# Patient Record
Sex: Female | Born: 1980 | Race: Black or African American | Hispanic: No | Marital: Single | Smoking: Never smoker
Health system: Southern US, Community
[De-identification: ages and names within clinical notes are randomized; demographics above are authoritative.]

## PROBLEM LIST (undated history)

## (undated) DIAGNOSIS — R519 Headache, unspecified: Secondary | ICD-10-CM

## (undated) DIAGNOSIS — B3731 Acute candidiasis of vulva and vagina: Secondary | ICD-10-CM

## (undated) DIAGNOSIS — B009 Herpesviral infection, unspecified: Secondary | ICD-10-CM

## (undated) DIAGNOSIS — B373 Candidiasis of vulva and vagina: Secondary | ICD-10-CM

## (undated) DIAGNOSIS — R51 Headache: Secondary | ICD-10-CM

## (undated) DIAGNOSIS — K219 Gastro-esophageal reflux disease without esophagitis: Secondary | ICD-10-CM

## (undated) HISTORY — DX: Candidiasis of vulva and vagina: B37.3

## (undated) HISTORY — PX: BREAST SURGERY: SHX581

## (undated) HISTORY — DX: Acute candidiasis of vulva and vagina: B37.31

## (undated) HISTORY — DX: Herpesviral infection, unspecified: B00.9

## (undated) HISTORY — DX: Gastro-esophageal reflux disease without esophagitis: K21.9

## (undated) HISTORY — PX: WISDOM TOOTH EXTRACTION: SHX21

---

## 2004-10-06 ENCOUNTER — Other Ambulatory Visit: Admission: RE | Admit: 2004-10-06 | Discharge: 2004-10-06 | Payer: Self-pay | Admitting: Family Medicine

## 2004-10-06 ENCOUNTER — Ambulatory Visit: Payer: Self-pay | Admitting: Family Medicine

## 2004-11-07 ENCOUNTER — Ambulatory Visit: Payer: Self-pay | Admitting: Family Medicine

## 2005-02-06 ENCOUNTER — Ambulatory Visit: Payer: Self-pay | Admitting: Family Medicine

## 2005-02-09 ENCOUNTER — Other Ambulatory Visit: Admission: RE | Admit: 2005-02-09 | Discharge: 2005-02-09 | Payer: Self-pay | Admitting: Family Medicine

## 2005-02-09 ENCOUNTER — Ambulatory Visit: Payer: Self-pay | Admitting: Family Medicine

## 2005-03-24 ENCOUNTER — Ambulatory Visit: Payer: Self-pay | Admitting: Family Medicine

## 2005-04-29 ENCOUNTER — Ambulatory Visit: Payer: Self-pay | Admitting: Family Medicine

## 2005-07-10 ENCOUNTER — Ambulatory Visit: Payer: Self-pay | Admitting: Family Medicine

## 2005-10-01 ENCOUNTER — Ambulatory Visit: Payer: Self-pay | Admitting: Family Medicine

## 2006-09-08 ENCOUNTER — Other Ambulatory Visit: Admission: RE | Admit: 2006-09-08 | Discharge: 2006-09-08 | Payer: Self-pay | Admitting: Family Medicine

## 2006-09-08 ENCOUNTER — Ambulatory Visit: Payer: Self-pay | Admitting: Family Medicine

## 2006-09-08 ENCOUNTER — Encounter: Payer: Self-pay | Admitting: Family Medicine

## 2006-09-08 DIAGNOSIS — K219 Gastro-esophageal reflux disease without esophagitis: Secondary | ICD-10-CM

## 2006-09-08 DIAGNOSIS — N760 Acute vaginitis: Secondary | ICD-10-CM | POA: Insufficient documentation

## 2006-09-10 ENCOUNTER — Encounter: Payer: Self-pay | Admitting: Family Medicine

## 2006-09-10 LAB — CONVERTED CEMR LAB
Trich, Wet Prep: NONE SEEN
WBC, Wet Prep HPF POC: NONE SEEN
Yeast Wet Prep HPF POC: NONE SEEN

## 2006-11-01 ENCOUNTER — Telehealth (INDEPENDENT_AMBULATORY_CARE_PROVIDER_SITE_OTHER): Payer: Self-pay | Admitting: *Deleted

## 2006-11-01 ENCOUNTER — Ambulatory Visit: Payer: Self-pay | Admitting: Family Medicine

## 2006-11-01 ENCOUNTER — Encounter: Admission: RE | Admit: 2006-11-01 | Discharge: 2006-11-01 | Payer: Self-pay | Admitting: Family Medicine

## 2006-11-01 DIAGNOSIS — R109 Unspecified abdominal pain: Secondary | ICD-10-CM | POA: Insufficient documentation

## 2006-11-01 DIAGNOSIS — K581 Irritable bowel syndrome with constipation: Secondary | ICD-10-CM | POA: Insufficient documentation

## 2006-11-01 DIAGNOSIS — K5900 Constipation, unspecified: Secondary | ICD-10-CM | POA: Insufficient documentation

## 2006-11-02 LAB — CONVERTED CEMR LAB
Alkaline Phosphatase: 62 units/L (ref 39–117)
BUN: 13 mg/dL (ref 6–23)
Basophils Absolute: 0 10*3/uL (ref 0.0–0.1)
Chloride: 104 meq/L (ref 96–112)
Creatinine, Ser: 0.78 mg/dL (ref 0.40–1.20)
Eosinophils Absolute: 0.2 10*3/uL (ref 0.0–0.7)
Eosinophils Relative: 2 % (ref 0–5)
HCT: 41.6 % (ref 36.0–46.0)
Lymphocytes Relative: 40 % (ref 12–46)
MCHC: 32.9 g/dL (ref 30.0–36.0)
MCV: 90.2 fL (ref 78.0–100.0)
Monocytes Relative: 9 % (ref 3–11)
Sodium: 138 meq/L (ref 135–145)
WBC: 8.6 10*3/uL (ref 4.0–10.5)

## 2006-11-03 ENCOUNTER — Telehealth (INDEPENDENT_AMBULATORY_CARE_PROVIDER_SITE_OTHER): Payer: Self-pay | Admitting: *Deleted

## 2007-02-09 ENCOUNTER — Ambulatory Visit: Payer: Self-pay | Admitting: Family Medicine

## 2007-02-09 DIAGNOSIS — N949 Unspecified condition associated with female genital organs and menstrual cycle: Secondary | ICD-10-CM

## 2007-02-10 ENCOUNTER — Telehealth: Payer: Self-pay | Admitting: Family Medicine

## 2007-03-01 ENCOUNTER — Ambulatory Visit: Payer: Self-pay | Admitting: Family Medicine

## 2007-03-01 DIAGNOSIS — L299 Pruritus, unspecified: Secondary | ICD-10-CM | POA: Insufficient documentation

## 2007-03-02 ENCOUNTER — Encounter: Payer: Self-pay | Admitting: Family Medicine

## 2007-03-15 ENCOUNTER — Ambulatory Visit: Payer: Self-pay | Admitting: Family Medicine

## 2007-03-18 ENCOUNTER — Encounter: Payer: Self-pay | Admitting: Family Medicine

## 2007-04-21 ENCOUNTER — Ambulatory Visit: Payer: Self-pay | Admitting: Family Medicine

## 2007-04-21 DIAGNOSIS — G43919 Migraine, unspecified, intractable, without status migrainosus: Secondary | ICD-10-CM | POA: Insufficient documentation

## 2007-05-03 ENCOUNTER — Telehealth: Payer: Self-pay | Admitting: Family Medicine

## 2007-05-23 ENCOUNTER — Ambulatory Visit: Payer: Self-pay | Admitting: Family Medicine

## 2007-05-23 DIAGNOSIS — G43009 Migraine without aura, not intractable, without status migrainosus: Secondary | ICD-10-CM | POA: Insufficient documentation

## 2007-06-22 ENCOUNTER — Ambulatory Visit: Payer: Self-pay | Admitting: Family Medicine

## 2007-06-22 ENCOUNTER — Other Ambulatory Visit: Admission: RE | Admit: 2007-06-22 | Discharge: 2007-06-22 | Payer: Self-pay | Admitting: Family Medicine

## 2007-06-22 ENCOUNTER — Encounter: Payer: Self-pay | Admitting: Family Medicine

## 2007-10-19 ENCOUNTER — Ambulatory Visit: Payer: Self-pay | Admitting: Obstetrics & Gynecology

## 2007-10-19 LAB — CONVERTED CEMR LAB: Chlamydia, DNA Probe: NEGATIVE

## 2007-11-30 ENCOUNTER — Ambulatory Visit: Payer: Self-pay | Admitting: Obstetrics & Gynecology

## 2007-12-01 ENCOUNTER — Encounter: Payer: Self-pay | Admitting: Obstetrics & Gynecology

## 2007-12-01 LAB — CONVERTED CEMR LAB: Trich, Wet Prep: NONE SEEN

## 2008-01-18 ENCOUNTER — Ambulatory Visit: Payer: Self-pay | Admitting: Obstetrics & Gynecology

## 2008-01-18 LAB — CONVERTED CEMR LAB

## 2008-01-19 ENCOUNTER — Encounter: Payer: Self-pay | Admitting: Obstetrics & Gynecology

## 2008-01-19 LAB — CONVERTED CEMR LAB
Chlamydia, Swab/Urine, PCR: NEGATIVE
TSH: 0.775 microintl units/mL (ref 0.350–4.50)

## 2008-01-31 ENCOUNTER — Encounter: Payer: Self-pay | Admitting: Obstetrics & Gynecology

## 2008-01-31 LAB — CONVERTED CEMR LAB: Prolactin: 14.3 ng/mL

## 2008-02-29 ENCOUNTER — Ambulatory Visit: Payer: Self-pay | Admitting: Obstetrics & Gynecology

## 2008-03-09 ENCOUNTER — Ambulatory Visit: Payer: Self-pay | Admitting: Family

## 2008-03-10 ENCOUNTER — Encounter: Payer: Self-pay | Admitting: Family

## 2008-03-10 LAB — CONVERTED CEMR LAB

## 2008-08-29 ENCOUNTER — Ambulatory Visit: Payer: Self-pay | Admitting: Obstetrics & Gynecology

## 2008-08-29 ENCOUNTER — Encounter: Payer: Self-pay | Admitting: Obstetrics & Gynecology

## 2008-08-29 ENCOUNTER — Other Ambulatory Visit: Admission: RE | Admit: 2008-08-29 | Discharge: 2008-08-29 | Payer: Self-pay | Admitting: Obstetrics & Gynecology

## 2008-08-30 ENCOUNTER — Encounter: Payer: Self-pay | Admitting: Obstetrics & Gynecology

## 2008-08-30 LAB — CONVERTED CEMR LAB

## 2008-10-10 ENCOUNTER — Ambulatory Visit: Payer: Self-pay | Admitting: Obstetrics & Gynecology

## 2008-10-10 LAB — CONVERTED CEMR LAB
Chlamydia, DNA Probe: NEGATIVE
GC Probe Amp, Genital: NEGATIVE
Trich, Wet Prep: NONE SEEN

## 2009-01-08 ENCOUNTER — Ambulatory Visit: Payer: Self-pay | Admitting: Obstetrics & Gynecology

## 2009-01-09 ENCOUNTER — Encounter: Payer: Self-pay | Admitting: Obstetrics & Gynecology

## 2009-01-09 LAB — CONVERTED CEMR LAB: Chlamydia, DNA Probe: NEGATIVE

## 2009-07-03 ENCOUNTER — Ambulatory Visit: Payer: Self-pay | Admitting: Obstetrics & Gynecology

## 2009-07-04 ENCOUNTER — Encounter: Payer: Self-pay | Admitting: Obstetrics & Gynecology

## 2009-07-04 LAB — CONVERTED CEMR LAB
Clue Cells Wet Prep HPF POC: NONE SEEN
WBC, Wet Prep HPF POC: NONE SEEN
Yeast Wet Prep HPF POC: NONE SEEN

## 2009-11-12 ENCOUNTER — Ambulatory Visit: Payer: Self-pay | Admitting: Obstetrics & Gynecology

## 2009-11-13 ENCOUNTER — Encounter: Payer: Self-pay | Admitting: Obstetrics & Gynecology

## 2009-11-13 LAB — CONVERTED CEMR LAB
Chlamydia, DNA Probe: NEGATIVE
Clue Cells Wet Prep HPF POC: NONE SEEN
Trich, Wet Prep: NONE SEEN

## 2010-05-20 NOTE — Assessment & Plan Note (Signed)
[name], [name]                ACCOUNT NO.:  [id number]   MEDICAL RECORD NO.:  [id number]          PATIENT TYPE:  POB   LOCATION:  [facility] [place]         FACILITY:  [facility]   PHYSICIAN:  Allie Bossier, MD        DATE OF BIRTH:  22-Jun-1980   DATE OF SERVICE:                                  CLINIC NOTE   [name] is a 30 year old engaged black gravida 1, para 1 with a 9-year-  old son.  She comes in today complaining of a vaginal discharge for the  last 3 days.  She complains of a fall.  Please note that she was treated  for BV back in November 2009, with Flagyl.  She is allergic to  AMOXICILLIN, but nothing else.   PHYSICAL EXAMINATION:  She does have some thin white discharge  consistent with BV, I have checked a wet prep and cervical cultures.  Please note that her last period was November 2009, pregnancy test is  negative, and she has a PeriGuard in, but I am ordering a TSH as well.  She has had weight gain.  As soon as the results of the wet prep are  back, we will call her, add a prescription as necessary.      Allie Bossier, MD     MCD/MEDQ  D:  01/18/2008  T:  01/19/2008  Job:  [id number]

## 2010-05-20 NOTE — Assessment & Plan Note (Signed)
NAMEAUTYM, SIESS                ACCOUNT NO.:  [id number]   MEDICAL RECORD NO.:  [id number]          PATIENT TYPE:  POB   LOCATION:  [facility] [place]         FACILITY:  [facility]   PHYSICIAN:  Elsie Lincoln, MD      DATE OF BIRTH:  1980-12-04   DATE OF SERVICE:  10/10/2008                                  CLINIC NOTE   The patient is a 30 year old female who presents for fishy odor.  No  itching.  The patient was diagnosed with bacterial vaginosis in August.  The patient has ParaGard in site.  The patient claims to be in  monogamous relationships but would like to be tested for gonorrhea and  Chlamydia.   PHYSICAL EXAMINATION:  GENITOURINARY:  Tanner V.  Vagina pink.  Normal  rugae.  Some increased white discharge.  No odor.  Cervical os.  ParaGard string seen.   A 30 year old female with vaginal discharge.  1. Gonorrhea and Chlamydia.  2. Wet prep.  3. Return p.r.n.  4. Health care maintenance up to date.           ______________________________  Elsie Lincoln, MD     KL/MEDQ  D:  10/10/2008  T:  10/11/2008  Job:  [id number]

## 2010-05-20 NOTE — Assessment & Plan Note (Signed)
[name], [name]                ACCOUNT NO.:  [id number]   MEDICAL RECORD NO.:  [id number]          PATIENT TYPE:  POB   LOCATION:  [facility] [place]         FACILITY:  [facility]   PHYSICIAN:  Allie Bossier, MD        DATE OF BIRTH:  06-12-1980   DATE OF SERVICE:  07/03/2009                                  CLINIC NOTE   [name] is a 30 year old lady who seems to have recurrent vaginal  infections.  She comes in today with again with a fishy odor discharge  that is itchy, multiple times she has been checked for GC and Chlamydia  that always been negative here, but recurrent wet prep continue to show  BV and yeast, so I am giving her a prescription for Cleocin cream to be  used for a week, I have given her 6 refills and 6 refills of Diflucan to  be used on a p.r.n. basis.      Allie Bossier, MD     MCD/MEDQ  D:  07/03/2009  T:  07/04/2009  Job:  [id number]

## 2010-05-20 NOTE — Assessment & Plan Note (Signed)
NAMEKASSAUNDRA, HAIR                ACCOUNT NO.:  [id number]   MEDICAL RECORD NO.:  [id number]          PATIENT TYPE:  POB   LOCATION:  [facility] [place]         FACILITY:  [facility]   PHYSICIAN:  Elsie Lincoln, MD      DATE OF BIRTH:  07/30/1980   DATE OF SERVICE:  10/19/2007                                  CLINIC NOTE   The patient is a 30 year old para 1 female who presents for an IUD  insertion.  She had her IUD placed somewhere to be 9-10 years ago.  She  is not sure exactly when.  We will go ahead and do it today, as we do  not want to expire.  We will keep good records in our chart, so next  time she knows exactly when she is to be removed that would be October 18, 2017.   PAST MEDICAL HISTORY:  Denies all problems.   PAST SURGICAL HISTORY:  Denies all surgeries.  No history of a blood  transfusion.   OBSTETRICAL HISTORY:  NSVD x1.   GYNECOLOGICAL HISTORY:  No history of abnormal Pap smear.  No history of  gonorrhea and chlamydia or pelvic inflammatory disease or ectopic  pregnancy.  She currently has an IUD in place.   PHYSICAL EXAMINATION:  VITAL SIGNS:  Afebrile, stable.  ABDOMEN:  Soft, nontender, nondistended.  No rebound or guarding.  GENITALIA:  Tanner V.  Vagina pink, normal rugae.  Cervix closed,  nontender.  Uterus anteverted, nontender.   PROCEDURE NOTE:  After informed consent was obtained, the patient was  placed in dorsal lithotomy position, prepared and draped in normal  sterile fashion.  Betadine was used to clean the cervix and until the  cervix was grasped with single-tooth tenaculum.  The IUD was removed.  GC and chlamydia cultures were taken prior to cleaning the cervix with  Betadine.  The uterus sounded to approximately 6.5 cm and IUD was  inserted without problem.  Strings were cut to 4 cm.  The patient  tolerated the procedure well.  The patient told to take Motrin for pain.   ASSESSMENT/PLAN:  A 30 year old female for intrauterine device  removal  and insertion.  This was done without incident.  The patient is to come  back in 6 weeks for string check.           ______________________________  Elsie Lincoln, MD     KL/MEDQ  D:  10/19/2007  T:  10/20/2007  Job:  [id number]

## 2010-05-20 NOTE — Assessment & Plan Note (Signed)
NAMETAREA, [name]                ACCOUNT NO.:  [id number]   MEDICAL RECORD NO.:  [id number]          PATIENT TYPE:  POB   LOCATION:  [facility] [place]         FACILITY:  [facility]   PHYSICIAN:  Jaynie Collins, MD     DATE OF BIRTH:  March 13, 1980   DATE OF SERVICE:  01/08/2009                                  CLINIC NOTE   CHIEF COMPLAINT:  Vaginal discharge.   HISTORY OF PRESENT ILLNESS:  The patient is a 30 year old gravida 1,  para 1 who is here today complaining of abnormal vaginal discharge for  over a month.  She does report that the vaginal discharge has a fishy  odor.  It is white and itchy.  Of note, she was diagnosed with bacterial  vaginosis in August and came back in October and was diagnosed with  another episode of bacterial vaginosis and also a yeast infection.  On  review of her chart, the patient has come in multiple times for this  complaint.  The patient denies any other symptoms.  She is just worried  about having this recurrent episodes of abnormal vaginal discharge.   PHYSICAL EXAMINATION:  On pelvic exam, the patient has normal external  female genitalia.  Pink, well-rugated vagina; increased white thick  discharge without any odor.  No erythema or lesions seen and normal  cervical os with ParaGard strings seen.   Wet prep and gonorrhea and chlamydia probes were obtained.   IMPRESSION:  The patient is a 30 year old gravida 1, para 1 here with  recurrent episode of abnormal vaginal discharge.  We will follow up the  gonorrhea and chlamydia cultures and wet prep and treat accordingly.  If  the patient does have another episode of bacterial vaginosis or yeast  infection, she may benefit from an extended regimen of an antifungal  cream or an antibiotic, this will depend on the results of her wet prep.  The patient was told that she will be called in 1-2 days with the  results and the appropriate therapy.  The patient may also benefit from  using boric acid  capsules per vagina, which could help restore the  normal pH of her vagina and also help prevent any further abnormal  vaginal discharge.  All this will be discussed with the patient.  Pending the results of her evaluation today.           ______________________________  Jaynie Collins, MD     UA/MEDQ  D:  01/08/2009  T:  01/09/2009  Job:  [id number]

## 2010-05-20 NOTE — Assessment & Plan Note (Signed)
[name], [name]                ACCOUNT NO.:  [id number]   MEDICAL RECORD NO.:  [id number]          PATIENT TYPE:  POB   LOCATION:  [facility] [place]         FACILITY:  [facility]   PHYSICIAN:  Allie Bossier, MD        DATE OF BIRTH:  06/04/1980   DATE OF SERVICE:  11/12/2009                                  CLINIC NOTE   HISTORY OF PRESENT ILLNESS:  [name] is a 30 year old divorced black lady  who comes in here with recurrent vaginal infections.  Her cultures were  always negative, when I saw her in June of this year, I gave her 6  refills of Diflucan and 6 refills of Cleocin she comes in today  complaining of discharge since this weekend as well as a new pain of her  vulva.  She says this has been there for several days.  She had been  having unprotected intercourse with her ex-husband and they do not use  condoms.  Please note she has a Mirena in for birth control.  On her  vulvar exams, she has a lot of thick white discharge coming from her  vagina.  On posterior perineum, she has an ulcerated lesion consistent  with herpes.  The thick white vaginal discharge is creamier than a  typical yeast infection, has no abnormal odor and it was sent for wet  prep.  Visualize the cervix after wiping away copious amounts of  discharge and did GC and Chlamydia cultures and her IUD strings were  seen.   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  I have given her acyclovir  for a week 5 times a day (she does not have insurance any longer).  I  have also recommend she go to the health department and have testing for  blood-borne STD.  I have recommended that she use condoms should she  continue to have intercourse.  I have recommended she use condoms from  now on and answered any questions she had with HSV.  I did do an HSV  culture but I explained that HSV cultures are sometimes misleading in  that they may have a false negative.      Allie Bossier, MD     MCD/MEDQ  D:  11/12/2009  T:   11/13/2009  Job:  [id number]

## 2010-05-20 NOTE — Assessment & Plan Note (Signed)
[name], [name]                ACCOUNT NO.:  [id number]   MEDICAL RECORD NO.:  [id number]          PATIENT TYPE:  POB   LOCATION:  [facility] [place]         FACILITY:  [facility]   PHYSICIAN:  Allie Bossier, MD        DATE OF BIRTH:  1980/03/06   DATE OF SERVICE:  08/29/2008                                  CLINIC NOTE   [name] is a 30 year old, single black, gravida 1, para 1, who comes  in here for her annual exam.  Her complaint today is a vaginal discharge  that is white with some odor.  She had the same complaint earlier this  year and a wet prep showed yeast.  She was treated.  She denies itching.   PAST MEDICAL HISTORY:  None.   PAST SURGICAL HISTORY:  C-section.   REVIEW OF SYSTEMS:  She has been abstinent for the last 3 months since  her engagement was called off.  She works at [employer].  Her periods are  monthly and last 6 days per month.   ALLERGIES:  No to LATEX allergies.  She describes a drug allergy as  amoxicillin, but says that it causes a yeast infection.  I have  explained that this is not truly a allergy.   FAMILY HISTORY:  Positive for breast cancer in 2 maternal aunts.   MEDICATIONS:  She has no particular medicines, but she does have a  ParaGard IUD in place.   PHYSICAL EXAMINATION:  VITAL SIGNS:  Her height is 5 feet 3 inches,  weight is 169 pounds, blood pressure is 113/72, pulse 87.  HEENT:  Normal.  HEART:  Regular rate and rhythm.  LUNGS:  Clear to auscultation bilaterally.  ABDOMEN:  Obese, benign.  Uterus upper limits of normal size.  Adnexa  nonpalpable and nontender.  Please note, her vulva and cervix and vagina  appeared normal, although there is a white discharge that I suspect is  bacterial vaginosis.   ASSESSMENT AND PLAN:  1. Annual exam.  I have checked Pap smear.  I recommend self-breast      exams monthly.  2. Discharge.  I have checked wet prep and cervical cultures.  We will      treat accordingly.  We will treat dependent on  these results.  She      will follow up in 1 year as sooner as necessary.      Allie Bossier, MD     MCD/MEDQ  D:  08/29/2008  T:  08/30/2008  Job:  [id number]

## 2010-08-31 ENCOUNTER — Other Ambulatory Visit: Payer: Self-pay | Admitting: Obstetrics & Gynecology

## 2010-09-02 ENCOUNTER — Encounter: Payer: Self-pay | Admitting: *Deleted

## 2010-09-02 ENCOUNTER — Telehealth: Payer: Self-pay | Admitting: *Deleted

## 2010-09-02 NOTE — Telephone Encounter (Signed)
LM for pt to call office concerning RF request.

## 2010-09-09 NOTE — Telephone Encounter (Signed)
[name], Can you call the patient and see why she needs refills on the diflucan and clinda?  Is she having more symptoms.  I filled them, but I would like some follow up please.

## 2010-12-01 ENCOUNTER — Encounter: Payer: Self-pay | Admitting: Family Medicine

## 2010-12-01 ENCOUNTER — Ambulatory Visit (INDEPENDENT_AMBULATORY_CARE_PROVIDER_SITE_OTHER): Payer: BC Managed Care – PPO | Admitting: Family Medicine

## 2010-12-01 DIAGNOSIS — T148XXA Other injury of unspecified body region, initial encounter: Secondary | ICD-10-CM

## 2010-12-01 DIAGNOSIS — R229 Localized swelling, mass and lump, unspecified: Secondary | ICD-10-CM

## 2010-12-01 MED ORDER — TRIAMCINOLONE ACETONIDE 0.5 % EX OINT: TOPICAL_OINTMENT | Freq: Every day | CUTANEOUS | Status: DC

## 2010-12-01 NOTE — Progress Notes (Addendum)
  Subjective:    Patient ID: [name], female    DOB: 09-21-1980, 30 y.o.   MRN: [id number]  HPI She is here today to followup on bilateral leg bruising. She said she hit her left shin on a wheelchair about 3 weeks ago. She went to [facility] and was given a prescription for Keflex to use for cellulitis. She also has several more bruises on her left leg and her right leg. She says they were more raised initially. She denies any red streaks on her legs. She denies any fever. She says they do feel sore and ache. She has also noticed that her left ankle will swell slightly by the end of the day. This started about 3 weeks ago as well. She never filled the Keflex.  Rash-complains of rash on both elbows. It is dry. She's been using over-the-counter hydrocortisone with only minimal relief. She currently works in a long term care facility.  Review of Systems     Objective:   Physical Exam  Constitutional: She is oriented to person, place, and time. She appears well-developed and well-nourished.  Musculoskeletal:       She has 2 large bruises over her left anterior shin. There is some slight erythema around the areas. Chest has 2 smaller bruises that seem to be healing on the left calf. And one on the right calf. There's no papillary or discoloration. They appear to be more of a deep palpable. They're mildly tender. No streaking erythema. Trace left ankle edema.  Neurological: She is alert and oriented to person, place, and time.  Skin: Skin is warm and dry.  Psychiatric: She has a normal mood and affect. Her behavior is normal.   On her elbow she also has a dry scaling rash. This is consistent with eczema.       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 We'll check a CBC and a sedimentation rate today. She denies any family history of autoimmune disorders or sarcoidosis. I do not think that this is cellulitis. If her lab work is normal then I recommend  compression wraps and continue to avoid any NSAIDs, which she has been using for headaches, which may be thinning of the blood. Will also check a hepatic panel and a TSH. She has no prior history of bleeding disorders.  Eczema-will treat with triamcinolone cream. If no significant improvement in her rash in 2 weeks and please contact the office.

## 2010-12-01 NOTE — Patient Instructions (Signed)
We will call you with your lab results. If you don't here from us in about a week then please give us a call at [phone].  

## 2010-12-02 LAB — HEPATIC FUNCTION PANEL
ALT: 8 U/L (ref 0–35)
AST: 13 U/L (ref 0–37)
Alkaline Phosphatase: 71 U/L (ref 39–117)
Bilirubin, Direct: 0.1 mg/dL (ref 0.0–0.3)
Indirect Bilirubin: 0.5 mg/dL (ref 0.0–0.9)
Total Bilirubin: 0.6 mg/dL (ref 0.3–1.2)
Total Protein: 7.5 g/dL (ref 6.0–8.3)

## 2010-12-02 LAB — CBC WITH DIFFERENTIAL/PLATELET
Basophils Absolute: 0 10*3/uL (ref 0.0–0.1)
HCT: 40.4 % (ref 36.0–46.0)
Hemoglobin: 12.8 g/dL (ref 12.0–15.0)
MCHC: 31.7 g/dL (ref 30.0–36.0)
Neutro Abs: 8.1 10*3/uL — ABNORMAL HIGH (ref 1.7–7.7)
Platelets: 435 10*3/uL — ABNORMAL HIGH (ref 150–400)
RBC: 4.47 MIL/uL (ref 3.87–5.11)
RDW: 14.3 % (ref 11.5–15.5)

## 2010-12-02 LAB — TSH: TSH: 0.913 u[IU]/mL (ref 0.350–4.500)

## 2010-12-02 LAB — SEDIMENTATION RATE: Sed Rate: 14 mm/hr (ref 0–22)

## 2010-12-02 MED ORDER — TRIAMCINOLONE ACETONIDE 0.5 % EX OINT: TOPICAL_OINTMENT | Freq: Every day | CUTANEOUS | Status: DC

## 2010-12-02 NOTE — Progress Notes (Signed)
Addended by: Ellsworth Lennox on: 12/02/2010 04:51 PM   Modules accepted: Orders

## 2010-12-08 ENCOUNTER — Ambulatory Visit: Payer: Self-pay | Admitting: Family Medicine

## 2010-12-18 ENCOUNTER — Encounter: Payer: Self-pay | Admitting: Family Medicine

## 2010-12-18 ENCOUNTER — Ambulatory Visit (INDEPENDENT_AMBULATORY_CARE_PROVIDER_SITE_OTHER): Payer: BC Managed Care – PPO | Admitting: Family Medicine

## 2010-12-18 VITALS — BP 110/72 | HR 54 | Wt 173.0 lb

## 2010-12-18 DIAGNOSIS — Z23 Encounter for immunization: Secondary | ICD-10-CM

## 2010-12-18 DIAGNOSIS — R21 Rash and other nonspecific skin eruption: Secondary | ICD-10-CM

## 2010-12-18 MED ORDER — CEPHALEXIN 500 MG PO CAPS: 500.0000 mg | ORAL_CAPSULE | Freq: Three times a day (TID) | ORAL | Status: AC

## 2010-12-18 NOTE — Progress Notes (Signed)
  Subjective:    Patient ID: [name], female    DOB: [birth year], 30 y.o.   MRN: [id number]  HPI RAsh on face for one year. Will break out monthly and then itch. She says that when it heals it creates a darkly pigmented spot. She didn't trust wear makeup to cover it up. She seems to be getting more of them. She never notices any actual pustules. She said she saw dermatology about a year ago and they felt that it was acne and try to treat her for that. She also said she had some redness across her right cheek yesterday but is actually better today. She denies any lung problems or shortness of breath. She denies any family history of autoimmune disorders or sarcoidosis. She does complain of some fatigue and some arthralgias. There no actual joint swelling.  Also rash on left medial leg for about 4 weeks. I saw her previously and we discussed a trial of Keflex that I was in 100% sure that it was cellulitis or not. She said she never filled the prescription but would like to now. She says it is still swollen and tender.   Review of Systems     Objective:   Physical Exam  On her face she has multiple darkly pigmented circular lesions. No actual erosions. She does have a more papular area on her chin that it is flesh-colored. It has some excoriation marks across it. Some of the lesions are half a centimeter up to about a centimeter. They're across her forehead, cheeks, chin, nose. The only other lesions she has it on her left inner leg above her ankle. She has one slightly swollen nodular area that is mildly erythematous and approximately 3 cm in size. Along the anterior tibia on the left leg she has to heal, darkly pigmented macular lesions that are approximately 2-3 cm each.      Assessment & Plan:  Rash-I a concern that she could have sarcoidosis. I also wonder if the redness that she sees at times could be a malar rash. She does not have any pulmonary symptoms at this point in time but does  complain of fatigue and some arthralgias. The nodules on her legs look somewhat like erythema nodosum. She wants to go ahead and fill the antibiotic to see if it improves. She conservatively do that if she would like but I do not think that it is actual cellulitis. I think this could potentially be a manifestation of sarcoidosis as well. I would like to refer her to [facility] for possible biopsy of the lesion. In the meantime we can get a chest x-ray.

## 2011-03-19 ENCOUNTER — Other Ambulatory Visit: Payer: Self-pay | Admitting: Obstetrics & Gynecology

## 2011-03-19 ENCOUNTER — Other Ambulatory Visit: Payer: Self-pay | Admitting: *Deleted

## 2011-03-19 DIAGNOSIS — N760 Acute vaginitis: Secondary | ICD-10-CM

## 2011-03-19 MED ORDER — CLINDAMYCIN PHOSPHATE 2 % VA CREA: 1.0000 | TOPICAL_CREAM | Freq: Every day | VAGINAL | Status: DC

## 2011-03-19 NOTE — Telephone Encounter (Signed)
Pt called stating that she had lost her box of Cleocin that she was given over 1 year ago and wanted to get a RF.  I told her she could have one but she would not get any further until she was seen in the office as she was several months overdue for her yearly.

## 2011-05-08 ENCOUNTER — Ambulatory Visit (INDEPENDENT_AMBULATORY_CARE_PROVIDER_SITE_OTHER): Payer: BC Managed Care – PPO | Admitting: Family Medicine

## 2011-05-08 ENCOUNTER — Encounter: Payer: Self-pay | Admitting: Family Medicine

## 2011-05-08 DIAGNOSIS — G43909 Migraine, unspecified, not intractable, without status migrainosus: Secondary | ICD-10-CM

## 2011-05-08 DIAGNOSIS — G4700 Insomnia, unspecified: Secondary | ICD-10-CM

## 2011-05-08 DIAGNOSIS — Z13220 Encounter for screening for lipoid disorders: Secondary | ICD-10-CM

## 2011-05-08 MED ORDER — RIZATRIPTAN BENZOATE 10 MG PO TBDP: 10.0000 mg | ORAL_TABLET | ORAL | Status: DC | PRN

## 2011-05-08 MED ORDER — TRAZODONE HCL 50 MG PO TABS: 50.0000 mg | ORAL_TABLET | Freq: Every day | ORAL | Status: DC

## 2011-05-08 NOTE — Progress Notes (Signed)
Subjective:    Patient ID: [name], female    DOB: [birth year], 31 y.o.   MRN: [id number]  HPI HA every other day to daily.  Will last about 30 minutes.  Occ will use excedrin but hasn' treally taken anything lately bc not really working. Says will start sweating first then HA.  Says will happen multiple times a day. No fever or vision changes.  Gets nauseted.  Says if eats something will vomit.  BP is OK.  Started about a month ago.  No new meds or supplements. No hx of thyroid problems. No CP or SOB or palpitations. Not sleeping well for months.  Only getting 3-4 times. Usually works 12- 14 hours. Her shifts change.  Hard time falling asleep and staying asleep.  Drinks some caffeine maybe every other day. Tried benadryl.  She doesn't snore.    Review of Systems  BP 108/65  Pulse 69  Ht 5' 3.5" (1.613 m)  Wt 178 lb (80.74 kg)  BMI 31.04 kg/m2    Allergies  Allergen Reactions  . Latex Rash  . Amoxicillin Other (See Comments)    REACTION: rash Pt states she always gets yeast infection    Past Medical History  Diagnosis Date  . Vaginal yeast infection     recurrent  . Herpes simplex without mention of complication     daily acyclovir    Past Surgical History  Procedure Date  . Cesarean section     History   Social History  . Marital Status: Single    Spouse Name: N/A    Number of Children: N/A  . Years of Education: N/A   Occupational History  . Not on file.   Social History Main Topics  . Smoking status: Never Smoker   . Smokeless tobacco: Never Used  . Alcohol Use: Not on file  . Drug Use: Not on file  . Sexually Active: Yes -- Female partner(s)   Other Topics Concern  . Not on file   Social History Narrative  . No narrative on file    Family History  Problem Relation Age of Onset  . Cancer Maternal Aunt     breast  . Cancer Maternal Aunt     breast    Outpatient Encounter Prescriptions as of 05/08/2011  Medication Sig Dispense Refill  .  clindamycin (CLEOCIN) 2 % vaginal cream Place 1 Applicatorful vaginally at bedtime. Vaginally every night x 7 days  40 g  0  . levonorgestrel (MIRENA) 20 MCG/24HR IUD 1 each by Intrauterine route once.        . rizatriptan (MAXALT-MLT) 10 MG disintegrating tablet Take 1 tablet (10 mg total) by mouth as needed for migraine. May repeat in 2 hours if needed  2 tablet  0  . traZODone (DESYREL) 50 MG tablet Take 1 tablet (50 mg total) by mouth at bedtime.  30 tablet  0          Objective:   Physical Exam  Constitutional: She is oriented to person, place, and time. She appears well-developed and well-nourished.  HENT:  Head: Normocephalic and atraumatic.  Right Ear: External ear normal.  Left Ear: External ear normal.  Nose: Nose normal.  Mouth/Throat: Oropharynx is clear and moist.       Tonsils are enlarged bilat. TMs and canals are clear.   Eyes: Conjunctivae and EOM are normal. Pupils are equal, round, and reactive to light.  Neck: Neck supple. No thyromegaly present.  Cardiovascular: Normal rate, regular  rhythm and normal heart sounds.        No carotid bruits.   Pulmonary/Chest: Effort normal and breath sounds normal. She has no wheezes.  Lymphadenopathy:    She has no cervical adenopathy.  Neurological: She is alert and oriented to person, place, and time.  Skin: Skin is warm and dry.  Psychiatric: She has a normal mood and affect.          Assessment & Plan:  Migraine - she has several factors that could be contributing to the increase in her migraines. With interesting is that Mr. headaches are only lasting 30 minutes which is a little bit unusual for migraine. She is having them almost daily and sometimes multiple times a day. We discussed working on her sleep quality. She's only getting 3-4 hours per night. We'll start trazodone. She was given 50 mg tablet. I encouraged her to start with half a tab and increased to 1 or 2 tabs if needed until I see her back in one month.  Also we discussed avoiding all caffeine as this certainly can also trigger her headaches. If she notices any chest pain or short of breath and please let me know. Will try a tryptan for rescue. She was given a sample of Maxalt 10 mg L., Relpax 40 mg, Frova  2.5 mg. *try these and see which one she likes the best. Do not take more than 2 times in one day. Will check CMP to rule out electrolyte imbalance as well as a fasting glucose to rule out any possible diabetes her sugar abnormality. She's also to skipping meals multiple times a day Siegrist her to eat more regularly to avoid any hypoglycemic events that may be triggering her headaches. Also check her thyroid and a blood count to rule out anemia.  Insmonia - Discusse that we really need to work on this to improve her HA. We discussed working on sleep quality and avoiding using TB to fall asleep. 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 Also avoid caffeine. We'll also start trazodone 50 mg. She can start with a half a tablet and increase to a whole tablet if needed to see if this helps. I did warn about potential sedation and certainly we can adjust her medication needed. Follow up in one month.

## 2011-05-08 NOTE — Patient Instructions (Signed)

## 2011-05-09 LAB — LIPID PANEL
Cholesterol: 136 mg/dL (ref 0–200)
Triglycerides: 78 mg/dL (ref <150)
VLDL: 16 mg/dL (ref 0–40)

## 2011-05-09 LAB — COMPLETE METABOLIC PANEL WITH GFR
ALT: 8 U/L (ref 0–35)
CO2: 28 mEq/L (ref 19–32)
Calcium: 9.3 mg/dL (ref 8.4–10.5)
Chloride: 105 mEq/L (ref 96–112)
GFR, Est Non African American: >89 mL/min
Potassium: 4.5 mEq/L (ref 3.5–5.3)
Total Protein: 7.1 g/dL (ref 6.0–8.3)

## 2011-05-09 LAB — CBC
Hemoglobin: 12 g/dL (ref 12.0–15.0)
MCV: 89.4 fL (ref 78.0–100.0)
RBC: 4.23 MIL/uL (ref 3.87–5.11)
RDW: 14.5 % (ref 11.5–15.5)

## 2011-05-09 LAB — TSH: TSH: 0.809 u[IU]/mL (ref 0.350–4.500)

## 2011-06-08 ENCOUNTER — Ambulatory Visit: Payer: BC Managed Care – PPO | Admitting: Family Medicine

## 2011-06-08 DIAGNOSIS — Z0289 Encounter for other administrative examinations: Secondary | ICD-10-CM

## 2011-09-12 ENCOUNTER — Other Ambulatory Visit: Payer: Self-pay | Admitting: Obstetrics & Gynecology

## 2011-09-16 ENCOUNTER — Other Ambulatory Visit: Payer: Self-pay | Admitting: Obstetrics & Gynecology

## 2011-09-16 ENCOUNTER — Encounter: Payer: Self-pay | Admitting: Obstetrics & Gynecology

## 2011-09-16 ENCOUNTER — Ambulatory Visit (INDEPENDENT_AMBULATORY_CARE_PROVIDER_SITE_OTHER): Payer: BC Managed Care – PPO | Admitting: Obstetrics & Gynecology

## 2011-09-16 VITALS — BP 111/77 | HR 87 | Temp 97.7°F | Resp 16 | Ht 63.0 in | Wt 170.0 lb

## 2011-09-16 DIAGNOSIS — N390 Urinary tract infection, site not specified: Secondary | ICD-10-CM

## 2011-09-16 DIAGNOSIS — B373 Candidiasis of vulva and vagina: Secondary | ICD-10-CM

## 2011-09-16 DIAGNOSIS — B3731 Acute candidiasis of vulva and vagina: Secondary | ICD-10-CM

## 2011-09-16 LAB — POCT URINALYSIS DIPSTICK
Ketones, UA: NEGATIVE
Nitrite, UA: NEGATIVE
Urobilinogen, UA: NEGATIVE

## 2011-09-16 MED ORDER — FLUCONAZOLE 150 MG PO TABS: 150.0000 mg | ORAL_TABLET | Freq: Once | ORAL | Status: AC

## 2011-09-16 NOTE — Addendum Note (Signed)
Addended by: Granville Lewis on: 09/16/2011 11:40 AM   Modules accepted: Orders

## 2011-09-16 NOTE — Progress Notes (Signed)
  Subjective:    Patient ID: [name], female    DOB: December 16, 1980, 31 y.o.   MRN: [id number]  HPI Pt complaining of vaginal discharge and burning for over a wek.  Pt has had yeast infections in the past and she feels like this is what is occurring now.  She is also having dysuria and burning.  Pt is not having a HSV 2 outbreak.  Pt feels the discharge is worse after her menses.  She also feels like her yeast infections started occurring after her Pargard was inserted.   Review of Systems  Constitutional: Negative.   Respiratory: Negative.   Cardiovascular: Negative.   Gastrointestinal: Negative.   Genitourinary: Positive for dysuria and vaginal discharge.       Objective:   Physical Exam  Vitals reviewed. Constitutional: She appears well-developed and well-nourished. No distress.  HENT:  Head: Normocephalic and atraumatic.  Pulmonary/Chest: Effort normal.  Abdominal: Soft.  Genitourinary: Vaginal discharge found.  Skin: Skin is warm and dry.  Psychiatric: She has a normal mood and affect.          Assessment & Plan:  31 yo female with symptoms of yeast infection  1-treat with diflucan given history 2-send wet prep and look for BV 3-Pt needs Pap smear 4-Ua, Ucx

## 2011-09-19 LAB — WET PREP, GENITAL: Yeast Wet Prep HPF POC: NONE SEEN

## 2011-09-20 LAB — CULTURE, URINE COMPREHENSIVE

## 2011-09-28 ENCOUNTER — Other Ambulatory Visit: Payer: Self-pay | Admitting: *Deleted

## 2011-09-28 DIAGNOSIS — B009 Herpesviral infection, unspecified: Secondary | ICD-10-CM

## 2011-09-28 MED ORDER — ACYCLOVIR 200 MG PO CAPS: 200.0000 mg | ORAL_CAPSULE | Freq: Two times a day (BID) | ORAL | Status: DC

## 2011-10-07 ENCOUNTER — Ambulatory Visit: Payer: BC Managed Care – PPO | Admitting: Obstetrics & Gynecology

## 2011-10-07 DIAGNOSIS — Z01419 Encounter for gynecological examination (general) (routine) without abnormal findings: Secondary | ICD-10-CM

## 2011-10-27 ENCOUNTER — Ambulatory Visit (INDEPENDENT_AMBULATORY_CARE_PROVIDER_SITE_OTHER): Payer: BC Managed Care – PPO | Admitting: Obstetrics & Gynecology

## 2011-10-27 ENCOUNTER — Encounter: Payer: Self-pay | Admitting: Obstetrics & Gynecology

## 2011-10-27 VITALS — BP 119/83 | HR 87 | Temp 97.1°F | Resp 16 | Ht 65.0 in | Wt 167.0 lb

## 2011-10-27 DIAGNOSIS — Z1151 Encounter for screening for human papillomavirus (HPV): Secondary | ICD-10-CM

## 2011-10-27 DIAGNOSIS — N6323 Unspecified lump in the left breast, lower outer quadrant: Secondary | ICD-10-CM

## 2011-10-27 DIAGNOSIS — N898 Other specified noninflammatory disorders of vagina: Secondary | ICD-10-CM

## 2011-10-27 DIAGNOSIS — Z113 Encounter for screening for infections with a predominantly sexual mode of transmission: Secondary | ICD-10-CM

## 2011-10-27 DIAGNOSIS — Z01419 Encounter for gynecological examination (general) (routine) without abnormal findings: Secondary | ICD-10-CM

## 2011-10-27 DIAGNOSIS — Z124 Encounter for screening for malignant neoplasm of cervix: Secondary | ICD-10-CM

## 2011-10-27 DIAGNOSIS — N926 Irregular menstruation, unspecified: Secondary | ICD-10-CM

## 2011-10-27 DIAGNOSIS — N63 Unspecified lump in unspecified breast: Secondary | ICD-10-CM

## 2011-10-27 NOTE — Progress Notes (Signed)
  Subjective:     [name] is a 31 y.o. female here for a routine exam.  Current complaints: heavy bleeding with IUD.  Pt bleeds for 2 weeks.  Cramps.  No history of fibroid tumors.  Personal health questionnaire reviewed: yes.   Gynecologic History Patient's last menstrual period was 10/04/2011. Contraception: IUD Last Pap: 2011. Results were: normal Last mammogram: n/a. Results were: n/a  Obstetric History OB History    Grav Para Term Preterm Abortions TAB SAB Ect Mult Living   1 1 1       1      # Outc Date GA Lbr Len/2nd Wgt Sex Del Anes PTL Lv   1 TRM                The following portions of the patient's history were reviewed and updated as appropriate: allergies, current medications, past family history, past medical history, past social history, past surgical history and problem list.  Review of Systems Pertinent items are noted in HPI.    Objective:   Filed Vitals:   10/27/11 1539  BP: 119/83  Pulse: 87  Temp: 97.1 F (36.2 C)  TempSrc: Oral  Resp: 16  Height: 5\' 5"  (1.651 m)  Weight: 167 lb (75.751 kg)     Vitals:  WNL General appearance: alert, cooperative and no distress Head: Normocephalic, without obvious abnormality, atraumatic Eyes: negative Throat: lips, mucosa, and tongue normal; teeth and gums normal Lungs: clear to auscultation bilaterally Breasts: normal appearance, no tenderness, No nipple retraction or dimpling, No nipple discharge or bleeding.  1 -2 cm mass in right breast at 5 o'clock.  Brests are difficult to examine due to architecture Heart: regular rate and rhythm Abdomen: soft, non-tender; bowel sounds normal; no masses,  no organomegaly Pelvic: cervix normal in appearance, external genitalia normal, no adnexal masses or tenderness, no bladder tenderness, no cervical motion tenderness, perianal skin: no external genital warts noted, rectovaginal septum normal, urethra without abnormality or discharge, uterus normal size, shape, and  consistency and vagina normal without discharge Extremities: no edema, redness or tenderness in the calves or thighs Skin: no lesions or rash Lymph nodes: Axillary adenopathy: none      Assessment:    Healthy female exam.    Plan:    Education reviewed: self breast exams, skin cancer screening, weight bearing exercise and std check. Contraception: IUD. Mammogram ordered. Follow up in: 3 weeks. cbc, tsh, pelvic US Diagnostic mammo of right beast

## 2011-10-28 ENCOUNTER — Other Ambulatory Visit: Payer: Self-pay | Admitting: Obstetrics & Gynecology

## 2011-10-28 DIAGNOSIS — N6323 Unspecified lump in the left breast, lower outer quadrant: Secondary | ICD-10-CM

## 2011-10-28 LAB — CBC
HCT: 37.5 % (ref 36.0–46.0)
Hemoglobin: 12.4 g/dL (ref 12.0–15.0)
RBC: 4.31 MIL/uL (ref 3.87–5.11)
WBC: 8.3 10*3/uL (ref 4.0–10.5)

## 2011-10-28 LAB — TSH: TSH: 0.916 u[IU]/mL (ref 0.350–4.500)

## 2011-10-29 ENCOUNTER — Telehealth: Payer: Self-pay | Admitting: *Deleted

## 2011-10-29 DIAGNOSIS — B9689 Other specified bacterial agents as the cause of diseases classified elsewhere: Secondary | ICD-10-CM

## 2011-10-29 LAB — WET PREP FOR TRICH, YEAST, CLUE
Trich, Wet Prep: NONE SEEN
Yeast Wet Prep HPF POC: NONE SEEN

## 2011-10-29 MED ORDER — METRONIDAZOLE 500 MG PO TABS: 500.0000 mg | ORAL_TABLET | Freq: Two times a day (BID) | ORAL | Status: DC

## 2011-10-29 NOTE — Telephone Encounter (Signed)
Pt positive for  Clue cells on her wet prep and RX for Flagyl 500 mg BID x 7 days sent to [facility]

## 2011-11-03 ENCOUNTER — Other Ambulatory Visit: Payer: BC Managed Care – PPO

## 2011-11-03 ENCOUNTER — Ambulatory Visit (INDEPENDENT_AMBULATORY_CARE_PROVIDER_SITE_OTHER): Payer: BC Managed Care – PPO

## 2011-11-03 DIAGNOSIS — N926 Irregular menstruation, unspecified: Secondary | ICD-10-CM

## 2011-11-03 DIAGNOSIS — D252 Subserosal leiomyoma of uterus: Secondary | ICD-10-CM

## 2011-11-04 ENCOUNTER — Other Ambulatory Visit: Payer: BC Managed Care – PPO

## 2011-11-06 ENCOUNTER — Other Ambulatory Visit: Payer: Self-pay | Admitting: Obstetrics & Gynecology

## 2011-11-06 ENCOUNTER — Ambulatory Visit
Admission: RE | Admit: 2011-11-06 | Discharge: 2011-11-06 | Disposition: A | Discharge status: Home / Self Care | Payer: BC Managed Care – PPO | Source: Ambulatory Visit | Attending: Obstetrics & Gynecology | Admitting: Obstetrics & Gynecology

## 2011-11-06 DIAGNOSIS — N6323 Unspecified lump in the left breast, lower outer quadrant: Secondary | ICD-10-CM

## 2012-01-08 ENCOUNTER — Encounter: Payer: Self-pay | Admitting: Sports Medicine

## 2012-01-08 ENCOUNTER — Ambulatory Visit (INDEPENDENT_AMBULATORY_CARE_PROVIDER_SITE_OTHER): Payer: BC Managed Care – PPO | Admitting: Sports Medicine

## 2012-01-08 VITALS — BP 125/83 | HR 92 | Temp 97.5°F | Wt 168.0 lb

## 2012-01-08 DIAGNOSIS — J019 Acute sinusitis, unspecified: Secondary | ICD-10-CM

## 2012-01-08 MED ORDER — FLUCONAZOLE 150 MG PO TABS: 150.0000 mg | ORAL_TABLET | Freq: Once | ORAL | Status: DC

## 2012-01-08 MED ORDER — HYDROCOD POLST-CHLORPHEN POLST 10-8 MG/5ML PO LQCR: 5.0000 mL | Freq: Two times a day (BID) | ORAL | Status: DC | PRN

## 2012-01-08 MED ORDER — FLUTICASONE PROPIONATE 50 MCG/ACT NA SUSP: NASAL | Status: DC

## 2012-01-08 MED ORDER — AMOXICILLIN-POT CLAVULANATE 875-125 MG PO TABS: 1.0000 | ORAL_TABLET | Freq: Two times a day (BID) | ORAL | Status: DC

## 2012-01-08 NOTE — Progress Notes (Signed)
Subjective:    CC: Sick  HPI: [name] is a very pleasant 32 year old female who comes in with a four-day history of cough productive of mucus, sinus pressure, fatigue, with occasional nausea but no GI symptoms. No rashes.  Past medical history, Surgical history, Family history, Social history, Allergies, and medications have been entered into the medical record, reviewed, and no changes needed.   Review of Systems: No fevers, chills, night sweats, weight loss, chest pain, or shortness of breath.   Objective:    General: Well Developed, well nourished, and in no acute distress.  Neuro: Alert and oriented x3, extra-ocular muscles intact.  HEENT: Normocephalic, atraumatic, pupils equal round reactive to light, neck supple, no masses, no lymphadenopathy, thyroid nonpalpable. Minimally tender to palpation over maxillary sinuses, oropharynx, nasopharynx, ear canals unremarkable to inspection. Skin: Warm and dry, no rashes. Cardiac: Regular rate and rhythm, no murmurs rubs or gallops.  Respiratory: Clear to auscultation bilaterally. Not using accessory muscles, speaking in full sentences.   Impression and Recommendations:

## 2012-01-08 NOTE — Assessment & Plan Note (Signed)
Augmentin, Flonase, Tussionex. Return to clinic on an as-needed basis. Will add diflucan as she does get yeast infections when using amoxicillin.

## 2012-01-22 ENCOUNTER — Emergency Department (INDEPENDENT_AMBULATORY_CARE_PROVIDER_SITE_OTHER): Payer: BC Managed Care – PPO

## 2012-01-22 ENCOUNTER — Encounter: Payer: Self-pay | Admitting: *Deleted

## 2012-01-22 ENCOUNTER — Emergency Department
Admission: EM | Admit: 2012-01-22 | Discharge: 2012-01-22 | Disposition: A | Discharge status: Home / Self Care | Payer: BC Managed Care – PPO | Source: Home / Self Care | Attending: Family Medicine | Admitting: Family Medicine

## 2012-01-22 DIAGNOSIS — R6889 Other general symptoms and signs: Secondary | ICD-10-CM

## 2012-01-22 DIAGNOSIS — R509 Fever, unspecified: Secondary | ICD-10-CM

## 2012-01-22 DIAGNOSIS — R05 Cough: Secondary | ICD-10-CM

## 2012-01-22 DIAGNOSIS — R0602 Shortness of breath: Secondary | ICD-10-CM

## 2012-01-22 DIAGNOSIS — J029 Acute pharyngitis, unspecified: Secondary | ICD-10-CM

## 2012-01-22 LAB — POCT RAPID STREP A (OFFICE): Rapid Strep A Screen: NEGATIVE

## 2012-01-22 MED ORDER — ONDANSETRON 4 MG PO TBDP: 4.0000 mg | ORAL_TABLET | Freq: Three times a day (TID) | ORAL | Status: DC | PRN

## 2012-01-22 MED ORDER — AZITHROMYCIN 250 MG PO TABS: ORAL_TABLET | ORAL | Status: DC

## 2012-01-22 MED ORDER — OSELTAMIVIR PHOSPHATE 75 MG PO CAPS: 75.0000 mg | ORAL_CAPSULE | Freq: Two times a day (BID) | ORAL | Status: DC

## 2012-01-22 NOTE — ED Provider Notes (Signed)
History     CSN: [id number]  Arrival date & time 01/22/12  1744   First MD Initiated Contact with Patient 01/22/12 1800      Chief Complaint  Patient presents with  . Sore Throat  . Cough  . Fever   HPI  URI Symptoms Onset: 2 days  Description: rhinorrhea, nasal congestion, sore throat, cough, body aches, chills, fever  Modifying factors:  Was seen by PCP approx 2 weeks ago and diagnosed with sinusitis. Was put on augmentin. Had some improvement in sxs. Had sudden onset of flu like sxs since yesterday. Has not had flu shot.   Symptoms Nasal discharge: yes Fever: yes Sore throat: mild Cough: yes Wheezing: no Ear pain: no GI symptoms: no Sick contacts: yes  Red Flags  Stiff neck: no Dyspnea: mild Rash: no Swallowing difficulty: no  Sinusitis Risk Factors Headache/face pain: no Double sickening: no tooth pain: no  Allergy Risk Factors Sneezing: no Itchy scratchy throat: no Seasonal symptoms: no  Flu Risk Factors Headache: yes muscle aches: yes severe fatigue: yes    Past Medical History  Diagnosis Date  . Vaginal yeast infection     recurrent  . Herpes simplex without mention of complication     daily acyclovir    Past Surgical History  Procedure Date  . Cesarean section     Family History  Problem Relation Age of Onset  . Cancer Maternal Aunt     breast  . Cancer Maternal Aunt     breast    History  Substance Use Topics  . Smoking status: Never Smoker   . Smokeless tobacco: Never Used  . Alcohol Use: No    OB History    Grav Para Term Preterm Abortions TAB SAB Ect Mult Living   1 1 1       1       Review of Systems  All other systems reviewed and are negative.    Allergies  Latex and Amoxicillin  Home Medications   Current Outpatient Rx  Name  Route  Sig  Dispense  Refill  . ACYCLOVIR 200 MG PO CAPS   Oral   Take 1 capsule (200 mg total) by mouth 2 (two) times daily.   60 capsule   1   . AMOXICILLIN-POT CLAVULANATE  875-125 MG PO TABS   Oral   Take 1 tablet by mouth 2 (two) times daily.   20 tablet   0   . AZITHROMYCIN 250 MG PO TABS      Take 2 tabs PO x 1 dose, then 1 tab PO QD x 4 days   6 tablet   0   . HYDROCOD POLST-CPM POLST ER 10-8 MG/5ML PO LQCR   Oral   Take 5 mLs by mouth every 12 (twelve) hours as needed (cough, will cause drowsiness.).   120 mL   0   . FLUCONAZOLE 150 MG PO TABS   Oral   Take 1 tablet (150 mg total) by mouth once.   1 tablet   0   . FLUTICASONE PROPIONATE 50 MCG/ACT NA SUSP      One spray in each nostril twice a day, use left hand for right nostril, and right hand for left nostril.   48 g   3   . LEVONORGESTREL 20 MCG/24HR IU IUD   Intrauterine   1 each by Intrauterine route once.           Marland Kitchen METRONIDAZOLE 500 MG PO TABS  Oral   Take 1 tablet (500 mg total) by mouth 2 (two) times daily.   14 tablet   0   . OSELTAMIVIR PHOSPHATE 75 MG PO CAPS   Oral   Take 1 capsule (75 mg total) by mouth 2 (two) times daily.   10 capsule   0   . RIZATRIPTAN BENZOATE 10 MG PO TBDP   Oral   Take 1 tablet (10 mg total) by mouth as needed for migraine. May repeat in 2 hours if needed   2 tablet   0   . TRAZODONE HCL 50 MG PO TABS   Oral   Take 50 mg by mouth at bedtime.           BP 131/86  Pulse 97  Temp 100.5 F (38.1 C) (Oral)  Resp 18  Ht 5\' 3"  (1.6 m)  Wt 167 lb (75.751 kg)  BMI 29.58 kg/m2  SpO2 97%  LMP 01/07/2012  Physical Exam  Constitutional: She appears well-developed and well-nourished.  HENT:  Head: Normocephalic and atraumatic.  Right Ear: External ear normal.  Left Ear: External ear normal.       +nasal erythema, rhinorrhea bilaterally, + post oropharyngeal erythema    Eyes: Conjunctivae normal are normal. Pupils are equal, round, and reactive to light.  Neck: Normal range of motion. Neck supple.  Cardiovascular: Normal rate and regular rhythm.   Pulmonary/Chest: Effort normal and breath sounds normal.  Abdominal: Soft.   Musculoskeletal: Normal range of motion.  Neurological: She is alert.  Skin: Skin is warm.    ED Course  Procedures (including critical care time)   Labs Reviewed  POCT RAPID STREP A (OFFICE)  POCT INFLUENZA A/B   Dg Chest 2 View  01/22/2012  *RADIOLOGY REPORT*  Clinical Data: Cough and fever  CHEST - 2 VIEW  Comparison: None.  Findings: Lungs are clear.  Heart size and pulmonary vascularity are normal.  No adenopathy.  No bone lesions.  IMPRESSION: No abnormality noted.   Original Report Authenticated By: Bretta Bang, M.D.      1. Flu-like symptoms       MDM  Rapid flu and rapid strep negative  Likely flu like illness.  Will start on tamiflu. Will transition to zpak for atypical coverage.  Discussed supportive care and infectious red flags. Follow up as needed.     The patient and/or caregiver has been counseled thoroughly with regard to treatment plan and/or medications prescribed including dosage, schedule, interactions, rationale for use, and possible side effects and they verbalize understanding. Diagnoses and expected course of recovery discussed and will return if not improved as expected or if the condition worsens. Patient and/or caregiver verbalized understanding.             Doree Albee, MD 01/22/12 [phone]

## 2012-01-22 NOTE — ED Notes (Signed)
Pt c/o sore throat, cough, body aches and fever x last night. She reports that she did not receive a flu vaccine.

## 2012-01-24 ENCOUNTER — Telehealth: Payer: Self-pay

## 2012-01-24 NOTE — ED Notes (Signed)
I called and spoke with patient and she is doing better. I advised to call back if anything changes or if she has questions or concerns.  

## 2012-02-03 ENCOUNTER — Ambulatory Visit: Payer: BC Managed Care – PPO | Admitting: Family Medicine

## 2012-02-03 DIAGNOSIS — Z0289 Encounter for other administrative examinations: Secondary | ICD-10-CM

## 2012-02-05 ENCOUNTER — Ambulatory Visit: Payer: BC Managed Care – PPO | Admitting: Family Medicine

## 2012-02-05 DIAGNOSIS — Z0289 Encounter for other administrative examinations: Secondary | ICD-10-CM

## 2012-02-16 ENCOUNTER — Ambulatory Visit: Payer: BC Managed Care – PPO | Admitting: Family Medicine

## 2012-02-17 ENCOUNTER — Ambulatory Visit (INDEPENDENT_AMBULATORY_CARE_PROVIDER_SITE_OTHER): Payer: BC Managed Care – PPO | Admitting: Family Medicine

## 2012-02-17 ENCOUNTER — Encounter: Payer: Self-pay | Admitting: Family Medicine

## 2012-02-17 ENCOUNTER — Other Ambulatory Visit (HOSPITAL_COMMUNITY)
Admission: RE | Admit: 2012-02-17 | Discharge: 2012-02-17 | Disposition: A | Discharge status: Home / Self Care | Payer: BC Managed Care – PPO | Source: Ambulatory Visit | Attending: Family Medicine | Admitting: Family Medicine

## 2012-02-17 VITALS — BP 112/72 | HR 96 | Ht 63.0 in | Wt 166.0 lb

## 2012-02-17 DIAGNOSIS — K2970 Gastritis, unspecified, without bleeding: Secondary | ICD-10-CM

## 2012-02-17 DIAGNOSIS — G4700 Insomnia, unspecified: Secondary | ICD-10-CM

## 2012-02-17 DIAGNOSIS — K2990 Gastroduodenitis, unspecified, without bleeding: Secondary | ICD-10-CM

## 2012-02-17 DIAGNOSIS — N760 Acute vaginitis: Secondary | ICD-10-CM

## 2012-02-17 MED ORDER — ZOLPIDEM TARTRATE 5 MG PO TABS: 5.0000 mg | ORAL_TABLET | Freq: Every evening | ORAL | Status: DC | PRN

## 2012-02-17 MED ORDER — METRONIDAZOLE 500 MG PO TABS: 500.0000 mg | ORAL_TABLET | Freq: Two times a day (BID) | ORAL | Status: DC

## 2012-02-17 NOTE — Addendum Note (Signed)
Addended by: Deno Etienne on: 02/17/2012 02:05 PM   Modules accepted: Orders

## 2012-02-17 NOTE — Progress Notes (Signed)
Subjective:    Patient ID: [name], female    DOB: [birth year], 32 y.o.   MRN: [id number]  HPI Says will get nauseated at night for the last month or so. She works 2nd shift and can't go to sleep until 3-4 AM.  Says eating food doesn't help much. No sure if food related or not eating. Getting occ heartburn. Will avoid ketchup, tomatoes, suaces bc will make her feel bad.  Still has GB.  Has had more gas and belching.  She has cut back on her caffiene. Not daily.  Not on PPI.  Occ gets RLQ pain.   Insomnia-she still struggling with falling asleep. Unfortunately she works second third shift and sometimes alternates. This is created a problem for her. She's is a long time she gets home around 11:00 at night and then doesn't go to sleep until 3 or 4:00 in the morning. We have tried trazodone in the past and she says it really wasn't very helpful. We'll try to avoid habit-forming medications at that point in time. She just needs a point where she's getting frustrated and a lot of nights his only getting 3-4 hours of sleep.  She complains of increased vaginal discharge and odor after each menstrual cycle. She says it usually will last 2-3 weeks at a time it seems to clear up on its own she has another period and it starts over again. She denies any abdominal pain, cramping or fever. There is no abnormal color. No rash, no itching, no perineal lesions.  She is having wear pads daily to control the odor. No dysuria. Review of Systems     BP 112/72  Pulse 96  Ht 5\' 3"  (1.6 m)  Wt 166 lb (75.297 kg)  BMI 29.41 kg/m2  LMP 02/02/2012    Allergies  Allergen Reactions  . Latex Rash  . Amoxicillin Other (See Comments)    REACTION: rash Pt states she always gets yeast infection    Past Medical History  Diagnosis Date  . Vaginal yeast infection     recurrent  . Herpes simplex without mention of complication     daily acyclovir    Past Surgical History  Procedure Laterality Date  . Cesarean  section      History   Social History  . Marital Status: Single    Spouse Name: N/A    Number of Children: N/A  . Years of Education: N/A   Occupational History  . Not on file.   Social History Main Topics  . Smoking status: Never Smoker   . Smokeless tobacco: Never Used  . Alcohol Use: No  . Drug Use: No  . Sexually Active: Yes -- Female partner(s)   Other Topics Concern  . Not on file   Social History Narrative  . No narrative on file    Family History  Problem Relation Age of Onset  . Cancer Maternal Aunt     breast  . Cancer Maternal Aunt     breast    Outpatient Encounter Prescriptions as of 02/17/2012  Medication Sig Dispense Refill  . acyclovir (ZOVIRAX) 200 MG capsule Take 1 capsule (200 mg total) by mouth 2 (two) times daily.  60 capsule  1  . fluticasone (FLONASE) 50 MCG/ACT nasal spray One spray in each nostril twice a day, use left hand for right nostril, and right hand for left nostril.  48 g  3  . levonorgestrel (MIRENA) 20 MCG/24HR IUD 1 each by Intrauterine route once.        Marland Kitchen  ondansetron (ZOFRAN ODT) 4 MG disintegrating tablet Take 1 tablet (4 mg total) by mouth every 8 (eight) hours as needed for nausea.  20 tablet  0  . rizatriptan (MAXALT-MLT) 10 MG disintegrating tablet Take 1 tablet (10 mg total) by mouth as needed for migraine. May repeat in 2 hours if needed  2 tablet  0  . metroNIDAZOLE (FLAGYL) 500 MG tablet Take 1 tablet (500 mg total) by mouth 2 (two) times daily.  14 tablet  0  . traZODone (DESYREL) 50 MG tablet Take 50 mg by mouth at bedtime.      Marland Kitchen zolpidem (AMBIEN) 5 MG tablet Take 1 tablet (5 mg total) by mouth at bedtime as needed for sleep.  30 tablet  0  . [DISCONTINUED] amoxicillin-clavulanate (AUGMENTIN) 875-125 MG per tablet Take 1 tablet by mouth 2 (two) times daily.  20 tablet  0  . [DISCONTINUED] azithromycin (ZITHROMAX) 250 MG tablet Take 2 tabs PO x 1 dose, then 1 tab PO QD x 4 days  6 tablet  0  . [DISCONTINUED]  chlorpheniramine-HYDROcodone (TUSSIONEX) 10-8 MG/5ML LQCR Take 5 mLs by mouth every 12 (twelve) hours as needed (cough, will cause drowsiness.).  120 mL  0  . [DISCONTINUED] fluconazole (DIFLUCAN) 150 MG tablet Take 1 tablet (150 mg total) by mouth once.  1 tablet  0  . [DISCONTINUED] metroNIDAZOLE (FLAGYL) 500 MG tablet Take 1 tablet (500 mg total) by mouth 2 (two) times daily.  14 tablet  0  . [DISCONTINUED] oseltamivir (TAMIFLU) 75 MG capsule Take 1 capsule (75 mg total) by mouth 2 (two) times daily.  10 capsule  0   No facility-administered encounter medications on file as of 02/17/2012.       Objective:   Physical Exam  Constitutional: She is oriented to person, place, and time. She appears well-developed and well-nourished.  HENT:  Head: Normocephalic and atraumatic.  Right Ear: External ear normal.  Left Ear: External ear normal.  Nose: Nose normal.  Mouth/Throat: Oropharynx is clear and moist.  TMs and canals are clear.   Eyes: Conjunctivae and EOM are normal. Pupils are equal, round, and reactive to light.  Neck: Neck supple. No thyromegaly present.  Cardiovascular: Normal rate, regular rhythm and normal heart sounds.   Pulmonary/Chest: Effort normal and breath sounds normal. She has no wheezes.  Lymphadenopathy:    She has no cervical adenopathy.  Neurological: She is alert and oriented to person, place, and time.  Skin: Skin is warm and dry.  Psychiatric: She has a normal mood and affect.          Assessment & Plan:  Vaginitis- based on her descriptions of her symptoms I think she has bacterial vaginitis. Wet prep was performed today. I will go ahead and send her for scription for metronidazole since we will likely be closed because of the weather later today and tomorrow. I think she'll be a good candidate for treatment after each menstrual cycle. I strong suspect she's getting recurrent bacterial vaginitis. It doesn't sound quite like a yeast infection.  Insomnia -  discussed different options. This point I think we can go to Ambien. I warned about dependency issues. I recommend taking it once nightly for 1-2 weeks as her reestablish a sleep pattern after that to just use it as needed. I will also give her a handout on some strategies for sleep hygiene and trying to set a standard bedtime if at all possible though unfortunately she has alternating shifts which makes this  very difficult.  Gastritis - discussed Ms. likely diagnosis based on her symptoms. She also has a history of reflux. Given samples of Nexium for 10 days. If it significantly helps her discomfort and I recommended she take an over-the-counter PPI for 6-8 weeks and then taper off as she's feeling better. If she still having symptoms on the Nexium and asked her to call the office in 2 weeks and let me know so we can do further workup and evaluation for her pain and nausea.

## 2012-02-17 NOTE — Patient Instructions (Addendum)
Insomnia Insomnia is frequent trouble falling and/or staying asleep. Insomnia can be a long term problem or a short term problem. Both are common. Insomnia can be a short term problem when the wakefulness is related to a certain stress or worry. Long term insomnia is often related to ongoing stress during waking hours and/or poor sleeping habits. Overtime, sleep deprivation itself can make the problem worse. Every little thing feels more severe because you are overtired and your ability to cope is decreased. CAUSES   Stress, anxiety, and depression.  Poor sleeping habits.  Distractions such as TV in the bedroom.  Naps close to bedtime.  Engaging in emotionally charged conversations before bed.  Technical reading before sleep.  Alcohol and other sedatives. They may make the problem worse. They can hurt normal sleep patterns and normal dream activity.  Stimulants such as caffeine for several hours prior to bedtime.  Pain syndromes and shortness of breath can cause insomnia.  Exercise late at night.  Changing time zones may cause sleeping problems (jet lag). It is sometimes helpful to have someone observe your sleeping patterns. They should look for periods of not breathing during the night (sleep apnea). They should also look to see how long those periods last. If you live alone or observers are uncertain, you can also be observed at a sleep clinic where your sleep patterns will be professionally monitored. Sleep apnea requires a checkup and treatment. Give your caregivers your medical history. Give your caregivers observations your family has made about your sleep.  SYMPTOMS   Not feeling rested in the morning.  Anxiety and restlessness at bedtime.  Difficulty falling and staying asleep. TREATMENT   Your caregiver may prescribe treatment for an underlying medical disorders. Your caregiver can give advice or help if you are using alcohol or other drugs for self-medication. Treatment  of underlying problems will usually eliminate insomnia problems.  Medications can be prescribed for short time use. They are generally not recommended for lengthy use.  Over-the-counter sleep medicines are not recommended for lengthy use. They can be habit forming.  You can promote easier sleeping by making lifestyle changes such as:  Using relaxation techniques that help with breathing and reduce muscle tension.  Exercising earlier in the day.  Changing your diet and the time of your last meal. No night time snacks.  Establish a regular time to go to bed.  Counseling can help with stressful problems and worry.  Soothing music and white noise may be helpful if there are background noises you cannot remove.  Stop tedious detailed work at least one hour before bedtime. HOME CARE INSTRUCTIONS   Keep a diary. Inform your caregiver about your progress. This includes any medication side effects. See your caregiver regularly. Take note of:  Times when you are asleep.  Times when you are awake during the night.  The quality of your sleep.  How you feel the next day. This information will help your caregiver care for you.  Get out of bed if you are still awake after 15 minutes. Read or do some quiet activity. Keep the lights down. Wait until you feel sleepy and go back to bed.  Keep regular sleeping and waking hours. Avoid naps.  Exercise regularly.  Avoid distractions at bedtime. Distractions include watching television or engaging in any intense or detailed activity like attempting to balance the household checkbook.  Develop a bedtime ritual. Keep a familiar routine of bathing, brushing your teeth, climbing into bed at the same   time each night, listening to soothing music. Routines increase the success of falling to sleep faster.  Use relaxation techniques. This can be using breathing and muscle tension release routines. It can also include visualizing peaceful scenes. You can  also help control troubling or intruding thoughts by keeping your mind occupied with boring or repetitive thoughts like the old concept of counting sheep. You can make it more creative like imagining planting one beautiful flower after another in your backyard garden.  During your day, work to eliminate stress. When this is not possible use some of the previous suggestions to help reduce the anxiety that accompanies stressful situations. MAKE SURE YOU:   Understand these instructions.  Will watch your condition.  Will get help right away if you are not doing well or get worse. Document Released: 12/20/1999 Document Revised: 03/16/2011 Document Reviewed: 01/19/2007 [facility] Patient Information 2013 [place], [place]. Insomnia Insomnia is frequent trouble falling and/or staying asleep. Insomnia can be a long term problem or a short term problem. Both are common. Insomnia can be a short term problem when the wakefulness is related to a certain stress or worry. Long term insomnia is often related to ongoing stress during waking hours and/or poor sleeping habits. Overtime, sleep deprivation itself can make the problem worse. Every little thing feels more severe because you are overtired and your ability to cope is decreased. CAUSES   Stress, anxiety, and depression.  Poor sleeping habits.  Distractions such as TV in the bedroom.  Naps close to bedtime.  Engaging in emotionally charged conversations before bed.  Technical reading before sleep.  Alcohol and other sedatives. They may make the problem worse. They can hurt normal sleep patterns and normal dream activity.  Stimulants such as caffeine for several hours prior to bedtime.  Pain syndromes and shortness of breath can cause insomnia.  Exercise late at night.  Changing time zones may cause sleeping problems (jet lag). It is sometimes helpful to have someone observe your sleeping patterns. They should look for periods of not  breathing during the night (sleep apnea). They should also look to see how long those periods last. If you live alone or observers are uncertain, you can also be observed at a sleep clinic where your sleep patterns will be professionally monitored. Sleep apnea requires a checkup and treatment. Give your caregivers your medical history. Give your caregivers observations your family has made about your sleep.  SYMPTOMS   Not feeling rested in the morning.  Anxiety and restlessness at bedtime.  Difficulty falling and staying asleep. TREATMENT   Your caregiver may prescribe treatment for an underlying medical disorders. Your caregiver can give advice or help if you are using alcohol or other drugs for self-medication. Treatment of underlying problems will usually eliminate insomnia problems.  Medications can be prescribed for short time use. They are generally not recommended for lengthy use.  Over-the-counter sleep medicines are not recommended for lengthy use. They can be habit forming.  You can promote easier sleeping by making lifestyle changes such as:  Using relaxation techniques that help with breathing and reduce muscle tension.  Exercising earlier in the day.  Changing your diet and the time of your last meal. No night time snacks.  Establish a regular time to go to bed.  Counseling can help with stressful problems and worry.  Soothing music and white noise may be helpful if there are background noises you cannot remove.  Stop tedious detailed work at least one hour before bedtime.  HOME CARE INSTRUCTIONS   Keep a diary. Inform your caregiver about your progress. This includes any medication side effects. See your caregiver regularly. Take note of:  Times when you are asleep.  Times when you are awake during the night.  The quality of your sleep.  How you feel the next day. This information will help your caregiver care for you.  Get out of bed if you are still awake  after 15 minutes. Read or do some quiet activity. Keep the lights down. Wait until you feel sleepy and go back to bed.  Keep regular sleeping and waking hours. Avoid naps.  Exercise regularly.  Avoid distractions at bedtime. Distractions include watching television or engaging in any intense or detailed activity like attempting to balance the household checkbook.  Develop a bedtime ritual. Keep a familiar routine of bathing, brushing your teeth, climbing into bed at the same time each night, listening to soothing music. Routines increase the success of falling to sleep faster.  Use relaxation techniques. This can be using breathing and muscle tension release routines. It can also include visualizing peaceful scenes. You can also help control troubling or intruding thoughts by keeping your mind occupied with boring or repetitive thoughts like the old concept of counting sheep. You can make it more creative like imagining planting one beautiful flower after another in your backyard garden.  During your day, work to eliminate stress. When this is not possible use some of the previous suggestions to help reduce the anxiety that accompanies stressful situations. MAKE SURE YOU:   Understand these instructions.  Will watch your condition.  Will get help right away if you are not doing well or get worse. Document Released: 12/20/1999 Document Revised: 03/16/2011 Document Reviewed: 01/19/2007 [facility] Patient Information 2013 [place], [place].

## 2012-02-23 ENCOUNTER — Other Ambulatory Visit: Payer: Self-pay | Admitting: Family Medicine

## 2012-02-23 MED ORDER — METRONIDAZOLE 0.75 % VA GEL: 1.0000 | VAGINAL | Status: DC

## 2012-02-26 ENCOUNTER — Telehealth: Payer: Self-pay

## 2012-02-26 DIAGNOSIS — J019 Acute sinusitis, unspecified: Secondary | ICD-10-CM

## 2012-02-26 MED ORDER — FLUCONAZOLE 150 MG PO TABS: 150.0000 mg | ORAL_TABLET | Freq: Once | ORAL | Status: DC

## 2012-02-26 NOTE — Telephone Encounter (Signed)
Let see if can see Lesly Rubenstein this afternoon or can try some monsitat over the weekend and see if clears up by Monday.

## 2012-02-26 NOTE — Telephone Encounter (Signed)
Prescription sent for single Diflucan.

## 2012-02-26 NOTE — Telephone Encounter (Signed)
[name] complains of white vagina discharge and itching. She believes she has a yeast infection. Denies pelvic pain, fever, chills or sweats.    [facility]

## 2012-02-26 NOTE — Telephone Encounter (Signed)
[name] states Dr Penne Lash gave her a prescription for Diflucan that she could refill as needed. She would like a refill on this medication. See other note. She also states her birthday is this weekend and she does not want to be on Monistat this weekend.

## 2012-02-26 NOTE — Telephone Encounter (Signed)
Left detailed message.   

## 2012-02-29 ENCOUNTER — Other Ambulatory Visit: Payer: Self-pay | Admitting: *Deleted

## 2012-02-29 ENCOUNTER — Encounter: Payer: Self-pay | Admitting: Family Medicine

## 2012-02-29 ENCOUNTER — Ambulatory Visit (INDEPENDENT_AMBULATORY_CARE_PROVIDER_SITE_OTHER): Payer: BC Managed Care – PPO | Admitting: Family Medicine

## 2012-02-29 ENCOUNTER — Other Ambulatory Visit (HOSPITAL_COMMUNITY)
Admission: RE | Admit: 2012-02-29 | Discharge: 2012-02-29 | Disposition: A | Discharge status: Home / Self Care | Payer: BC Managed Care – PPO | Source: Ambulatory Visit | Attending: Family Medicine | Admitting: Family Medicine

## 2012-02-29 VITALS — BP 122/79 | HR 111 | Wt 165.0 lb

## 2012-02-29 DIAGNOSIS — B9689 Other specified bacterial agents as the cause of diseases classified elsewhere: Secondary | ICD-10-CM

## 2012-02-29 DIAGNOSIS — N949 Unspecified condition associated with female genital organs and menstrual cycle: Secondary | ICD-10-CM

## 2012-02-29 DIAGNOSIS — A499 Bacterial infection, unspecified: Secondary | ICD-10-CM

## 2012-02-29 DIAGNOSIS — N760 Acute vaginitis: Secondary | ICD-10-CM | POA: Insufficient documentation

## 2012-02-29 DIAGNOSIS — R102 Pelvic and perineal pain: Secondary | ICD-10-CM

## 2012-02-29 NOTE — Progress Notes (Addendum)
  Subjective:    Patient ID: [name], female    DOB: 04/12/1980, 32 y.o.   MRN: [id number]  HPI Says felt like the metronidazole caused a yeast infection. She started having some itching and thick d/c.  She called here on Friday and we sent in a diflucan.  Then felt worse afterwards. Still umcomfortable and irritated today but better overall.  Says she is tender.  No spots or sores or lesions. Last pap was in October. Neg for GC and chlam in October.  She requests to be tested again.    Review of Systems     Objective:   Physical Exam  Constitutional: She is oriented to person, place, and time. She appears well-developed and well-nourished.  HENT:  Head: Normocephalic and atraumatic.  Neurological: She is alert and oriented to person, place, and time.  Skin: Skin is warm and dry.  Psychiatric: She has a normal mood and affect. Her behavior is normal.          Assessment & Plan:  Vaginitis - will collect wet prep today. If the infection is clear there she's a little irritation today. She does request gonorrhea and Chlamydia testing today. She was negative in October. She denies any actual lesions.  Recurrent bacterial vaginitis-encouraged her to start using metro gel twice a week for the next 4-6 months to prevent recurrent infection. At that point she will stop we'll see how she does. I'm still not convinced that she necessarily had a full-blown yeast infection. All the wet preps in the system have been negative for yeast and have always been positive for bacterial vaginitis.

## 2012-03-16 ENCOUNTER — Other Ambulatory Visit: Payer: Self-pay | Admitting: *Deleted

## 2012-03-16 MED ORDER — ZOLPIDEM TARTRATE 5 MG PO TABS: 5.0000 mg | ORAL_TABLET | Freq: Every evening | ORAL | Status: DC | PRN

## 2012-03-17 ENCOUNTER — Other Ambulatory Visit: Payer: Self-pay | Admitting: Family Medicine

## 2012-05-11 ENCOUNTER — Encounter: Payer: Self-pay | Admitting: Family Medicine

## 2012-05-11 ENCOUNTER — Ambulatory Visit (INDEPENDENT_AMBULATORY_CARE_PROVIDER_SITE_OTHER): Payer: BC Managed Care – PPO | Admitting: Family Medicine

## 2012-05-11 VITALS — BP 126/85 | HR 90 | Wt 161.0 lb

## 2012-05-11 DIAGNOSIS — S39012A Strain of muscle, fascia and tendon of lower back, initial encounter: Secondary | ICD-10-CM

## 2012-05-11 DIAGNOSIS — K219 Gastro-esophageal reflux disease without esophagitis: Secondary | ICD-10-CM

## 2012-05-11 DIAGNOSIS — IMO0002 Reserved for concepts with insufficient information to code with codable children: Secondary | ICD-10-CM

## 2012-05-11 MED ORDER — OMEPRAZOLE 40 MG PO CPDR: 40.0000 mg | DELAYED_RELEASE_CAPSULE | Freq: Every day | ORAL | Status: DC

## 2012-05-11 MED ORDER — MELOXICAM 15 MG PO TABS: 15.0000 mg | ORAL_TABLET | Freq: Every day | ORAL | Status: DC

## 2012-05-11 MED ORDER — CYCLOBENZAPRINE HCL 10 MG PO TABS: ORAL_TABLET | ORAL | Status: DC

## 2012-05-11 NOTE — Progress Notes (Signed)
CC: [name] is a 32 y.o. female is here for Back Pain and Gastrophageal Reflux   Subjective: HPI:  Patient complains of worsening epigastric pain. This has been present for one to 2 months. She had this originally start half a year ago. It was managed well with 2 weeks of Nexium followed by over-the-counter ranitidine only to deteriorate 2 months ago. She's currently taking TUMS, ranitidine 150 mg twice a day with only minimal improvement from her daily moderate severity. Symptoms are worse with acidic foods such as tomatoes, chocolate, or [facility]. Nothing particularly makes symptoms better. She denies vomiting, food aversion, waking from pain, right upper quadrant pain, nor radiation of her epigastric pain which she describes as a burning. Denies unintentional weight loss, diarrhea, constipation  Patient complains of back pain is been present for one week. 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 She describes it as a soreness and tightness in her low back just to the right and left of her spine. It is worse when trying to lie flat or sitting, is significantly improved by standing or lying on her side. No interventions as of yet. It is nonradiating. She denies midline back pain, sob paresthesia, motor or sensory disturbances, nor weakness in the legs   Review Of Systems Outlined In HPI  Past Medical History  Diagnosis Date  . Vaginal yeast infection     recurrent  . Herpes simplex without mention of complication     daily acyclovir     Family History  Problem Relation Age of Onset  . Cancer Maternal Aunt     breast  . Cancer Maternal Aunt     breast     History  Substance Use Topics  . Smoking status: Never Smoker   . Smokeless tobacco: Never Used  . Alcohol Use: No     Objective: Filed Vitals:   05/11/12 1340  BP: 126/85  Pulse: 90    General: Alert and Oriented, No Acute Distress HEENT: Pupils equal, round, reactive to  light. Conjunctivae clear.  Moist mucous membranes with a unremarkable pharynx without inflammation Lungs: Clear to auscultation bilaterally, no wheezing/ronchi/rales.  Comfortable work of breathing. Good air movement. Cardiac: Regular rate and rhythm. Normal S1/S2.  No murmurs, rubs, nor gallops.   Abdomen: Soft with mild tenderness in the epigastric region, negative Murphy's Extremities: No peripheral edema.  Strong peripheral pulses. L4 and S1 DTRs two over four bilaterally with full range of motion and strength in both lower extremities Back: No midline spinous process pain in the lumbar region, pain is reproduced with palpation of lumbar paraspinal musculature, straight leg raise negative, stork test negative Mental Status: No depression, anxiety, nor agitation. Skin: Warm and dry.  Assessment & Plan: [name] was seen today for back pain and gastrophageal reflux.  Diagnoses and associated orders for this visit:  Back strain, initial encounter - cyclobenzaprine (FLEXERIL) 10 MG tablet; Take a half to a full tab every 8-12 hours only as needed for muscle spasm, may cause sedation. - meloxicam (MOBIC) 15 MG tablet; Take 1 tablet (15 mg total) by mouth daily.  GERD (gastroesophageal reflux disease)  Other Orders - omeprazole (PRILOSEC) 40 MG capsule; Take 1 capsule (40 mg total) by mouth daily.    Back strain: Low suspicion for skeletal etiology of pain, will treat with relative rest and meloxicam with as needed cyclobenzaprine. Discussed relative rest but avoid immobilization, return if not improved significantly in 2 weeks  GERD: Worsening of her chronic GERD, discussed dietary interventions along with restarting 15 days sample of Nexium a prescription for omeprazole was given anticipating that this will be cheaper however samples will provide bufferr in case of prior authorization delay  Return in about 4 weeks (around 06/08/2012).

## 2012-11-03 ENCOUNTER — Ambulatory Visit: Payer: BC Managed Care – PPO | Admitting: Family Medicine

## 2012-11-23 ENCOUNTER — Ambulatory Visit: Payer: BC Managed Care – PPO | Admitting: Physician Assistant

## 2012-11-24 ENCOUNTER — Encounter: Payer: Self-pay | Admitting: Physician Assistant

## 2012-11-24 ENCOUNTER — Ambulatory Visit (INDEPENDENT_AMBULATORY_CARE_PROVIDER_SITE_OTHER): Payer: 59 | Admitting: Physician Assistant

## 2012-11-24 VITALS — BP 116/79 | HR 100 | Wt 167.0 lb

## 2012-11-24 DIAGNOSIS — B9689 Other specified bacterial agents as the cause of diseases classified elsewhere: Secondary | ICD-10-CM

## 2012-11-24 DIAGNOSIS — A499 Bacterial infection, unspecified: Secondary | ICD-10-CM

## 2012-11-24 DIAGNOSIS — N898 Other specified noninflammatory disorders of vagina: Secondary | ICD-10-CM

## 2012-11-24 DIAGNOSIS — N760 Acute vaginitis: Secondary | ICD-10-CM

## 2012-11-24 LAB — WET PREP FOR TRICH, YEAST, CLUE

## 2012-11-24 MED ORDER — METRONIDAZOLE 500 MG PO TABS: 500.0000 mg | ORAL_TABLET | Freq: Two times a day (BID) | ORAL | Status: DC

## 2012-11-24 MED ORDER — FLUCONAZOLE 150 MG PO TABS: 150.0000 mg | ORAL_TABLET | Freq: Every day | ORAL | Status: DC

## 2012-11-24 NOTE — Patient Instructions (Signed)
Bacterial Vaginosis Bacterial vaginosis (BV) is a vaginal infection where the normal balance of bacteria in the vagina is disrupted. The normal balance is then replaced by an overgrowth of certain bacteria. There are several different kinds of bacteria that can cause BV. BV is the most common vaginal infection in women of childbearing age. CAUSES   The cause of BV is not fully understood. BV develops when there is an increase or imbalance of harmful bacteria.  Some activities or behaviors can upset the normal balance of bacteria in the vagina and put women at increased risk including:  Having a new sex partner or multiple sex partners.  Douching.  Using an intrauterine device (IUD) for contraception.  It is not clear what role sexual activity plays in the development of BV. However, women that have never had sexual intercourse are rarely infected with BV. Women do not get BV from toilet seats, bedding, swimming pools or from touching objects around them.  SYMPTOMS   Grey vaginal discharge.  A fish-like odor with discharge, especially after sexual intercourse.  Itching or burning of the vagina and vulva.  Burning or pain with urination.  Some women have no signs or symptoms at all. DIAGNOSIS  Your caregiver must examine the vagina for signs of BV. Your caregiver will perform lab tests and look at the sample of vaginal fluid through a microscope. They will look for bacteria and abnormal cells (clue cells), a pH test higher than 4.5, and a positive amine test all associated with BV.  RISKS AND COMPLICATIONS   Pelvic inflammatory disease (PID).  Infections following gynecology surgery.  Developing HIV.  Developing herpes virus. TREATMENT  Sometimes BV will clear up without treatment. However, all women with symptoms of BV should be treated to avoid complications, especially if gynecology surgery is planned. Female partners generally do not need to be treated. However, BV may spread  between female sex partners so treatment is helpful in preventing a recurrence of BV.   BV may be treated with antibiotics. The antibiotics come in either pill or vaginal cream forms. Either can be used with nonpregnant or pregnant women, but the recommended dosages differ. These antibiotics are not harmful to the baby.  BV can recur after treatment. If this happens, a second round of antibiotics will often be prescribed.  Treatment is important for pregnant women. If not treated, BV can cause a premature delivery, especially for a pregnant woman who had a premature birth in the past. All pregnant women who have symptoms of BV should be checked and treated.  For chronic reoccurrence of BV, treatment with a type of prescribed gel vaginally twice a week is helpful. HOME CARE INSTRUCTIONS   Finish all medication as directed by your caregiver.  Do not have sex until treatment is completed.  Tell your sexual partner that you have a vaginal infection. They should see their caregiver and be treated if they have problems, such as a mild rash or itching.  Practice safe sex. Use condoms. Only have 1 sex partner. PREVENTION  Basic prevention steps can help reduce the risk of upsetting the natural balance of bacteria in the vagina and developing BV:  Do not have sexual intercourse (be abstinent).  Do not douche.  Use all of the medicine prescribed for treatment of BV, even if the signs and symptoms go away.  Tell your sex partner if you have BV. That way, they can be treated, if needed, to prevent reoccurrence. SEEK MEDICAL CARE IF:     Your symptoms are not improving after 3 days of treatment.  You have increased discharge, pain, or fever. MAKE SURE YOU:   Understand these instructions.  Will watch your condition.  Will get help right away if you are not doing well or get worse. FOR MORE INFORMATION  Division of STD Prevention (DSTDP), Centers for Disease Control and Prevention:  www.cdc.gov/std American Social Health Association (ASHA): www.ashastd.org  Document Released: 12/22/2004 Document Revised: 03/16/2011 Document Reviewed: 08/03/2012 [employer] Patient Information 2014 [employer].  

## 2012-11-24 NOTE — Progress Notes (Signed)
  Subjective:    Patient ID: [name], female    DOB: 1980-06-28, 32 y.o.   MRN: [id number]  HPI Patient is a 32 year old female who presents to clinic with vaginal discharge for the last 3 days. Patient has a history of recurrent bacterial vaginosis. Patient is on metronidazole gel twice weekly. She's been using this regularly. It has helped to decrease her recurrence the of bacterial vaginosis. She describes her discharge today as an off white and discharge with odor. She just recently finished her menstrual cycle couple days ago. She denies any urinary changes or pain with urination. She denies any fever, chills, nausea, vomiting or back pain. She has not had a new sexual partner.   Review of Systems     Objective:   Physical Exam  Constitutional: She is oriented to person, place, and time. She appears well-developed and well-nourished.  HENT:  Head: Normocephalic and atraumatic.  Cardiovascular: Normal rate and normal heart sounds.   Pulmonary/Chest: Effort normal and breath sounds normal.  Abdominal: Soft. Bowel sounds are normal. There is no tenderness.  Neurological: She is alert and oriented to person, place, and time.  Skin: Skin is warm and dry.  Psychiatric: She has a normal mood and affect. Her behavior is normal.          Assessment & Plan:  Bacterial vaginosis- confirmed with positive clue cells and wet prep. Patient is well aware of the causes of bacterial vaginosis. We discussed that perhaps the Mirena aiding in recurrent BV. Continue using preventative metronidazole gel for prevention. Consider talking with OB/GYN about mirena and if could be causing these infections.will treat for today with pills version of Metronidzazole for 7 days. Per pt she gets yeast infections with metronidazole pills. Gave Diflucan one tab now and one tablet for when she finished metronidazole.

## 2012-12-12 ENCOUNTER — Ambulatory Visit: Payer: 59 | Admitting: Family Medicine

## 2013-01-10 ENCOUNTER — Encounter: Payer: Self-pay | Admitting: Family Medicine

## 2013-01-10 ENCOUNTER — Ambulatory Visit (INDEPENDENT_AMBULATORY_CARE_PROVIDER_SITE_OTHER): Payer: 59 | Admitting: Family Medicine

## 2013-01-10 VITALS — BP 121/76 | HR 86 | Temp 97.7°F | Ht 63.0 in | Wt 172.0 lb

## 2013-01-10 DIAGNOSIS — R350 Frequency of micturition: Secondary | ICD-10-CM

## 2013-01-10 DIAGNOSIS — R5383 Other fatigue: Principal | ICD-10-CM

## 2013-01-10 DIAGNOSIS — R5381 Other malaise: Secondary | ICD-10-CM

## 2013-01-10 DIAGNOSIS — R61 Generalized hyperhidrosis: Secondary | ICD-10-CM

## 2013-01-10 NOTE — Progress Notes (Signed)
Subjective:    Patient ID: [name], female    DOB: 11/16/1980, 33 y.o.   MRN: [id number]  HPI Hot flashes and night sweats-x 2 mos wondering if she is starting to go thru menopause Not sure when mom went through menopause.  Uses aleve for her period. She has an IUD in.  Her period was only 4 days.  Usually last 7 days. She has the paraguard IUD.  She doesn't think she is pregnant.  No fam hx fo thyroid.  Last period was 01/01/13.  Having more frequent HA. Not sleeping as well. Will take a cool shower at 2AM.  Started 2 months ago. No major mood changes.  Quit drinking [employer].  Has been craving more sweets like chocolate.    No Gi sxs.  Increased urinartion. + fam hx of DM.   Feels very fatigued. She has gained about 5 pounds recently.  No worsening or alleviating symptoms.      Review of Systems BP 121/76  Pulse 86  Temp(Src) 97.7 F (36.5 C)  Ht 5\' 3"  (1.6 m)  Wt 172 lb (78.019 kg)  BMI 30.48 kg/m2  LMP 01/01/2013    Allergies  Allergen Reactions  . Latex Rash  . Amoxicillin Other (See Comments)    REACTION: rash Pt states she always gets yeast infection    Past Medical History  Diagnosis Date  . Vaginal yeast infection     recurrent  . Herpes simplex without mention of complication     daily acyclovir    Past Surgical History  Procedure Laterality Date  . Cesarean section      History   Social History  . Marital Status: Single    Spouse Name: N/A    Number of Children: N/A  . Years of Education: N/A   Occupational History  . Not on file.   Social History Main Topics  . Smoking status: Never Smoker   . Smokeless tobacco: Never Used  . Alcohol Use: No  . Drug Use: No  . Sexual Activity: Yes    Partners: Male   Other Topics Concern  . Not on file   Social History Narrative  . No narrative on file    Family History  Problem Relation Age of Onset  . Cancer Maternal Aunt     breast  . Cancer Maternal Aunt     breast    Outpatient  Encounter Prescriptions as of 01/10/2013  Medication Sig  . levonorgestrel (MIRENA) 20 MCG/24HR IUD 1 each by Intrauterine route once.    . [DISCONTINUED] fluconazole (DIFLUCAN) 150 MG tablet Take 1 tablet (150 mg total) by mouth daily. Take one tablet now and one tablet after finishing metronidazole.  . [DISCONTINUED] metroNIDAZOLE (FLAGYL) 500 MG tablet Take 1 tablet (500 mg total) by mouth 2 (two) times daily. For 7 days.          Objective:   Physical Exam  Constitutional: She is oriented to person, place, and time. She appears well-developed and well-nourished.  HENT:  Head: Normocephalic and atraumatic.  Neck: Neck supple. No thyromegaly present.  Cardiovascular: Normal rate, regular rhythm and normal heart sounds.   Pulmonary/Chest: Effort normal and breath sounds normal.  Abdominal: Soft. Bowel sounds are normal. She exhibits no distension.  Lymphadenopathy:    She has no cervical adenopathy.  Neurological: She is alert and oriented to person, place, and time.  Skin: Skin is warm and dry.  Psychiatric: She has a normal mood and affect.  Her behavior is normal.          Assessment & Plan:  Hot flashes/night sweats-will check her thyroid as well as a CBC to evaluate for anemia. We'll check electrolytes as well. She does have a family history of diabetes and has noticed some increased urination and thirst. Will evaluate for diabetes. It is certainly not a medication side effect as she is not currently taking any prescriptions.  Fatigue-please see above. CBC, CMP, TSH, hemoglobin A1c.

## 2013-01-11 LAB — CBC
HEMATOCRIT: 36.7 % (ref 36.0–46.0)
Hemoglobin: 12.3 g/dL (ref 12.0–15.0)
MCH: 29 pg (ref 26.0–34.0)
MCHC: 33.5 g/dL (ref 30.0–36.0)
MCV: 86.6 fL (ref 78.0–100.0)
PLATELETS: 403 10*3/uL — AB (ref 150–400)
RBC: 4.24 MIL/uL (ref 3.87–5.11)
RDW: 15 % (ref 11.5–15.5)
WBC: 7.3 10*3/uL (ref 4.0–10.5)

## 2013-01-11 LAB — FERRITIN: Ferritin: 13 ng/mL (ref 10–291)

## 2013-01-11 LAB — HEMOGLOBIN A1C
Hgb A1c MFr Bld: 6.1 % — ABNORMAL HIGH (ref <5.7)
MEAN PLASMA GLUCOSE: 128 mg/dL — AB (ref <117)

## 2013-01-11 LAB — COMPLETE METABOLIC PANEL WITH GFR
ALT: 8 U/L (ref 0–35)
AST: 14 U/L (ref 0–37)
Albumin: 4.6 g/dL (ref 3.5–5.2)
Alkaline Phosphatase: 58 U/L (ref 39–117)
BILIRUBIN TOTAL: 0.3 mg/dL (ref 0.3–1.2)
BUN: 15 mg/dL (ref 6–23)
CALCIUM: 9.6 mg/dL (ref 8.4–10.5)
CHLORIDE: 103 meq/L (ref 96–112)
CO2: 26 meq/L (ref 19–32)
CREATININE: 0.79 mg/dL (ref 0.50–1.10)
GLUCOSE: 82 mg/dL (ref 70–99)
Potassium: 3.8 mEq/L (ref 3.5–5.3)
Sodium: 138 mEq/L (ref 135–145)
Total Protein: 7.5 g/dL (ref 6.0–8.3)

## 2013-01-11 LAB — TSH: TSH: 0.857 u[IU]/mL (ref 0.350–4.500)

## 2013-01-11 LAB — LUTEINIZING HORMONE: LH: 7.5 m[IU]/mL

## 2013-01-11 LAB — FOLATE: FOLATE: 14.4 ng/mL

## 2013-01-11 LAB — ESTRADIOL: Estradiol: 131 pg/mL

## 2013-01-11 LAB — PROGESTERONE: Progesterone: 0.4 ng/mL

## 2013-01-11 LAB — VITAMIN B12: VITAMIN B 12: 569 pg/mL (ref 211–911)

## 2013-01-11 LAB — FOLLICLE STIMULATING HORMONE: FSH: 4.3 m[IU]/mL

## 2013-01-25 ENCOUNTER — Encounter: Payer: 59 | Admitting: Family Medicine

## 2013-02-06 ENCOUNTER — Encounter: Payer: 59 | Admitting: Family Medicine

## 2013-02-23 ENCOUNTER — Encounter: Payer: 59 | Admitting: Family Medicine

## 2013-03-13 ENCOUNTER — Telehealth: Payer: Self-pay | Admitting: *Deleted

## 2013-03-13 DIAGNOSIS — B009 Herpesviral infection, unspecified: Secondary | ICD-10-CM

## 2013-03-13 MED ORDER — ACYCLOVIR 200 MG PO CAPS: 200.0000 mg | ORAL_CAPSULE | Freq: Two times a day (BID) | ORAL | Status: DC

## 2013-03-13 NOTE — Telephone Encounter (Signed)
RF given for 1 month on Acyclovir.  Pt will need appt before any further RF's.

## 2013-04-24 ENCOUNTER — Ambulatory Visit (INDEPENDENT_AMBULATORY_CARE_PROVIDER_SITE_OTHER): Payer: 59 | Admitting: Physician Assistant

## 2013-04-24 ENCOUNTER — Encounter: Payer: Self-pay | Admitting: Physician Assistant

## 2013-04-24 ENCOUNTER — Ambulatory Visit: Payer: 59 | Admitting: Family Medicine

## 2013-04-24 VITALS — BP 119/71 | HR 95 | Ht 63.0 in | Wt 169.0 lb

## 2013-04-24 DIAGNOSIS — M62838 Other muscle spasm: Secondary | ICD-10-CM

## 2013-04-24 MED ORDER — KETOROLAC TROMETHAMINE 60 MG/2ML IM SOLN
60.0000 mg | Freq: Once | INTRAMUSCULAR | Status: AC
  Administered 2013-04-24: 60 mg via INTRAMUSCULAR

## 2013-04-24 MED ORDER — TRAMADOL HCL 50 MG PO TABS: ORAL_TABLET | ORAL | Status: DC

## 2013-04-24 MED ORDER — IBUPROFEN 800 MG PO TABS: 800.0000 mg | ORAL_TABLET | Freq: Three times a day (TID) | ORAL | Status: DC | PRN

## 2013-04-24 MED ORDER — CYCLOBENZAPRINE HCL 10 MG PO TABS: 10.0000 mg | ORAL_TABLET | Freq: Three times a day (TID) | ORAL | Status: DC | PRN

## 2013-04-24 NOTE — Progress Notes (Signed)
   Subjective:    Patient ID: [name], female    DOB: 11-Mar-1980, 33 y.o.   MRN: [id number]  HPI Pt is a 33 yo female who presents to the clinic with neck pain for one week. 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 They are much heavier and she is having to lift them. Denies any trauma or injury. Pain is worse on left neck than right. Denies any numbness or tingling down arms. Worse with movement of head. Best when keep neck still. Ibuprofen does help some. Pain is 7/10 at some points during the day.  .   Review of Systems     Objective:   Physical Exam  Constitutional: She is oriented to person, place, and time. She appears well-developed and well-nourished.  HENT:  Head: Normocephalic and atraumatic.  Neck:  ROM limited left to right due to pain. Strength 5/5. No c-spine tenderness. Pain to palpation up bilateral neck sides over muscles worse over left. Negative spurlings.   Cardiovascular: Normal rate, regular rhythm and normal heart sounds.   Pulmonary/Chest: Effort normal and breath sounds normal.  Neurological: She is alert and oriented to person, place, and time.  Skin: Skin is dry.  Psychiatric: She has a normal mood and affect. Her behavior is normal.          Assessment & Plan:  Muscle spasms of neck- gave stretches to start daily. Toradol 60mg  given today. Sent  Home with flexeril. Discussed sedation side effects. Ibuprofen 800mg  up to TID. Tramadol for break through pain. Heat and ice encouraged to alternate. Consider massage. Discussed proper lifting technique. Follow up if not improving in 2-4 weeks.

## 2013-05-31 ENCOUNTER — Other Ambulatory Visit: Payer: Self-pay | Admitting: *Deleted

## 2013-05-31 MED ORDER — CYCLOBENZAPRINE HCL 10 MG PO TABS: 10.0000 mg | ORAL_TABLET | Freq: Three times a day (TID) | ORAL | Status: DC | PRN

## 2013-05-31 MED ORDER — METRONIDAZOLE 0.75 % VA GEL
1.0000 | VAGINAL | Status: DC
Start: 2013-05-31 — End: 2013-12-07

## 2013-06-06 ENCOUNTER — Encounter: Payer: Self-pay | Admitting: Physician Assistant

## 2013-06-06 ENCOUNTER — Ambulatory Visit (INDEPENDENT_AMBULATORY_CARE_PROVIDER_SITE_OTHER): Payer: 59 | Admitting: Physician Assistant

## 2013-06-06 VITALS — BP 113/75 | HR 109 | Ht 63.0 in | Wt 170.0 lb

## 2013-06-06 DIAGNOSIS — N898 Other specified noninflammatory disorders of vagina: Secondary | ICD-10-CM

## 2013-06-06 DIAGNOSIS — R319 Hematuria, unspecified: Secondary | ICD-10-CM

## 2013-06-06 DIAGNOSIS — L293 Anogenital pruritus, unspecified: Secondary | ICD-10-CM

## 2013-06-06 DIAGNOSIS — R300 Dysuria: Secondary | ICD-10-CM

## 2013-06-06 LAB — POCT URINALYSIS DIPSTICK
Bilirubin, UA: NEGATIVE
GLUCOSE UA: NEGATIVE
Ketones, UA: NEGATIVE
Leukocytes, UA: NEGATIVE
NITRITE UA: NEGATIVE
PROTEIN UA: NEGATIVE
Spec Grav, UA: 1.02
Urobilinogen, UA: 0.2
pH, UA: 6.5

## 2013-06-06 NOTE — Progress Notes (Signed)
   Subjective:    Patient ID: [name], female    DOB: 1980-06-29, 33 y.o.   MRN: [id number]  HPI Patient is a 33 year old female who presents to the clinic with 4 days of vaginal discharge, vaginal itching and dysuria. She denies any fever, chills, nausea, vomiting. She has had some lower, pressure. She denies any flank pain. She has not tried anything to make better. She has a history of yeast infection, bacterial vaginosis and urinary tract infections. She describes her discharge today as a tan-white. It does have a light odor.    Review of Systems  All other systems reviewed and are negative.      Objective:   Physical Exam  Constitutional: She is oriented to person, place, and time. She appears well-developed and well-nourished.  HENT:  Head: Normocephalic and atraumatic.  Cardiovascular: Regular rhythm and normal heart sounds.   Tachycardia at 109.  Pulmonary/Chest: Effort normal and breath sounds normal.  No CVA tenderness.   Abdominal: Soft. Bowel sounds are normal. She exhibits no distension and no mass. There is no tenderness. There is no rebound and no guarding.  Neurological: She is alert and oriented to person, place, and time.  Skin: Skin is dry.  Psychiatric: She has a normal mood and affect. Her behavior is normal.          Assessment & Plan:     Dysuria/ vaginal itching/vaginal discharge- wet preps obtained a day for stat results. UA positive for blood but negative for leuks and nitrates. Will culture. Discussed with pt will call with results later today and what medications will be sent. Continue to stay hydrated. Call with any worsening symptoms.

## 2013-06-07 ENCOUNTER — Other Ambulatory Visit: Payer: Self-pay | Admitting: Physician Assistant

## 2013-06-07 LAB — WET PREP FOR TRICH, YEAST, CLUE
TRICH WET PREP: NONE SEEN
WBC, Wet Prep HPF POC: NONE SEEN
Yeast Wet Prep HPF POC: NONE SEEN

## 2013-06-09 ENCOUNTER — Ambulatory Visit (INDEPENDENT_AMBULATORY_CARE_PROVIDER_SITE_OTHER): Payer: 59 | Admitting: Family Medicine

## 2013-06-09 ENCOUNTER — Encounter: Payer: Self-pay | Admitting: Family Medicine

## 2013-06-09 VITALS — BP 104/67 | HR 103 | Wt 174.0 lb

## 2013-06-09 DIAGNOSIS — M549 Dorsalgia, unspecified: Secondary | ICD-10-CM

## 2013-06-09 LAB — URINE CULTURE: Colony Count: 40000

## 2013-06-09 MED ORDER — ORPHENADRINE CITRATE ER 100 MG PO TB12: 100.0000 mg | ORAL_TABLET | Freq: Two times a day (BID) | ORAL | Status: DC

## 2013-06-09 MED ORDER — MELOXICAM 15 MG PO TABS: 15.0000 mg | ORAL_TABLET | Freq: Every day | ORAL | Status: DC

## 2013-06-09 MED ORDER — KETOROLAC TROMETHAMINE 60 MG/2ML IM SOLN
60.0000 mg | Freq: Once | INTRAMUSCULAR | Status: AC
  Administered 2013-06-09: 60 mg via INTRAMUSCULAR

## 2013-06-09 NOTE — Progress Notes (Signed)
CC: [name] is a 33 y.o. female is here for Spasms   Subjective: HPI:  Complains of low back pain described as a tightness and spasm that is moderate in severity however improved to mild in severity at rest. Worse when turning to the left of the right or when standing for long periods of time. It has been present for 2 weeks without any significant improvement other than temporary relief with cyclobenzaprine however this causes her to fall sleep she only takes at night. Pain is nonradiating localized in the right lower back. Denies any recent trauma or overexertion.  Denies midline back pain, saddle paresthesia, bowel or bladder incontinence, weakness or motor/sensory disturbances in the extremities. Denies any abdominal pain, GI disturbance, nor genitourinary complaints   Review Of Systems Outlined In HPI  Past Medical History  Diagnosis Date  . Vaginal yeast infection     recurrent  . Herpes simplex without mention of complication     daily acyclovir    Past Surgical History  Procedure Laterality Date  . Cesarean section     Family History  Problem Relation Age of Onset  . Cancer Maternal Aunt     breast  . Cancer Maternal Aunt     breast    History   Social History  . Marital Status: Single    Spouse Name: N/A    Number of Children: N/A  . Years of Education: N/A   Occupational History  . Not on file.   Social History Main Topics  . Smoking status: Never Smoker   . Smokeless tobacco: Never Used  . Alcohol Use: No  . Drug Use: No  . Sexual Activity: Yes    Partners: Male   Other Topics Concern  . Not on file   Social History Narrative  . No narrative on file     Objective: BP 104/67  Pulse 103  Wt 174 lb (78.926 kg)  General: Alert and Oriented, No Acute Distress HEENT: Pupils equal, round, reactive to light. Conjunctivae clear.   Lungs: Clear to auscultation bilaterally, no wheezing/ronchi/rales.  Comfortable work of breathing. Good air  movement. Cardiac: Regular rate and rhythm. Normal S1/S2.  No murmurs, rubs, nor gallops.   Back: No midline spinous process tenderness no thoracic or lumbar region, pain is reproduced with palpation of paraspinal musculature to the right of T12 and L1, this musculature is quite hypertonic. Extremities: No peripheral edema.  Strong peripheral pulses. L4 and S1 DTRs two over four bilaterally with full range of motion strength in both lower extremities Mental Status: No depression, anxiety, nor agitation. Skin: Warm and dry. No overlying skin changes at the site of discomfort  Assessment & Plan: [name] was seen today for spasms.  Diagnoses and associated orders for this visit:  Back pain - orphenadrine (NORFLEX) 100 MG tablet; Take 1 tablet (100 mg total) by mouth 2 (two) times daily. - meloxicam (MOBIC) 15 MG tablet; Take 1 tablet (15 mg total) by mouth daily. - ketorolac (TORADOL) injection 60 mg; Inject 2 mLs (60 mg total) into the muscle once.    Back pain most likely due to spasm/strain therefore start Norflex in hopes of being less sedating than cyclobenzaprine, discussed using meloxicam on a daily basis with range of motion exercises. Toradol today to help with 8 hour shift tonight   Return if symptoms worsen or fail to improve.

## 2013-06-22 ENCOUNTER — Telehealth: Payer: Self-pay | Admitting: *Deleted

## 2013-06-22 NOTE — Telephone Encounter (Signed)
To called and lvm did not indicate why she was calling. I returned her call and she does not have a vm set up.[name] [place]

## 2013-07-04 ENCOUNTER — Telehealth: Payer: Self-pay | Admitting: Family Medicine

## 2013-07-04 MED ORDER — IBUPROFEN 800 MG PO TABS: 800.0000 mg | ORAL_TABLET | Freq: Three times a day (TID) | ORAL | Status: DC | PRN

## 2013-07-04 NOTE — Telephone Encounter (Signed)
Pt called. She wants refill on Ibuprofen because this is all she can take during the day. She complained about nobody calling her back but I told her that Dr's assistant did call her back but she does not have vm set up. I asked her if there was another number we could call her on that had vm she said she didn't.

## 2013-07-04 NOTE — Telephone Encounter (Signed)
rx sent.Barkley, Tonya Lynetta  

## 2013-07-06 ENCOUNTER — Telehealth: Payer: Self-pay | Admitting: *Deleted

## 2013-07-06 NOTE — Telephone Encounter (Signed)
Pt called and stated that she has been trying to get her IBU refilled. I informed her that this was sent on 6.30 and that she should call her pharmacy. If she has any problems then call me back. She voiced understanding and agreed.Audelia Hives [place]

## 2013-08-21 ENCOUNTER — Encounter: Payer: 59 | Admitting: Family Medicine

## 2013-08-23 ENCOUNTER — Other Ambulatory Visit: Payer: Self-pay | Admitting: Family Medicine

## 2013-11-06 ENCOUNTER — Encounter: Payer: Self-pay | Admitting: Family Medicine

## 2013-11-17 ENCOUNTER — Encounter: Payer: 59 | Admitting: Family Medicine

## 2013-12-07 ENCOUNTER — Encounter: Payer: Self-pay | Admitting: Family Medicine

## 2013-12-07 ENCOUNTER — Ambulatory Visit (INDEPENDENT_AMBULATORY_CARE_PROVIDER_SITE_OTHER): Payer: 59 | Admitting: Family Medicine

## 2013-12-07 ENCOUNTER — Other Ambulatory Visit (HOSPITAL_COMMUNITY)
Admission: RE | Admit: 2013-12-07 | Discharge: 2013-12-07 | Disposition: A | Discharge status: Home / Self Care | Payer: 59 | Source: Ambulatory Visit | Attending: Family Medicine | Admitting: Family Medicine

## 2013-12-07 VITALS — BP 107/70 | HR 101 | Wt 177.0 lb

## 2013-12-07 DIAGNOSIS — Z113 Encounter for screening for infections with a predominantly sexual mode of transmission: Secondary | ICD-10-CM | POA: Insufficient documentation

## 2013-12-07 DIAGNOSIS — B009 Herpesviral infection, unspecified: Secondary | ICD-10-CM

## 2013-12-07 DIAGNOSIS — Z01419 Encounter for gynecological examination (general) (routine) without abnormal findings: Secondary | ICD-10-CM | POA: Diagnosis present

## 2013-12-07 MED ORDER — METRONIDAZOLE 0.75 % VA GEL: 1.0000 | VAGINAL | Status: DC

## 2013-12-07 MED ORDER — CYCLOBENZAPRINE HCL 10 MG PO TABS: 10.0000 mg | ORAL_TABLET | Freq: Three times a day (TID) | ORAL | Status: DC | PRN

## 2013-12-07 MED ORDER — ACYCLOVIR 400 MG PO TABS: 400.0000 mg | ORAL_TABLET | Freq: Three times a day (TID) | ORAL | Status: DC

## 2013-12-07 NOTE — Patient Instructions (Signed)
Keep up a regular exercise program and make sure you are eating a healthy diet Try to eat 4 servings of dairy a day, or if you are lactose intolerant take a calcium with vitamin D daily.  Your vaccines are up to date.   

## 2013-12-07 NOTE — Progress Notes (Signed)
  Subjective:     [name] is a 33 y.o. female and is here for a comprehensive physical exam. The patient reports problems - has been having alot of gas.Marland Kitchen  History   Social History  . Marital Status: Single    Spouse Name: N/A    Number of Children: N/A  . Years of Education: N/A   Occupational History  . Not on file.   Social History Main Topics  . Smoking status: Never Smoker   . Smokeless tobacco: Never Used  . Alcohol Use: No  . Drug Use: No  . Sexual Activity:    Partners: Male   Other Topics Concern  . Not on file   Social History Narrative   Health Maintenance  Topic Date Due  . INFLUENZA VACCINE  08/06/2014  . PAP SMEAR  10/27/2014  . TETANUS/TDAP  12/17/2020    The following portions of the patient's history were reviewed and updated as appropriate: allergies, current medications, past family history, past medical history, past social history, past surgical history and problem list.  Review of Systems A comprehensive review of systems was negative.   Objective:    There were no vitals taken for this visit. General appearance: alert, cooperative and appears stated age Head: Normocephalic, without obvious abnormality, atraumatic Eyes: conj clear, EOMi, PEERLA Ears: normal TM's and external ear canals both ears Nose: Nares normal. Septum midline. Mucosa normal. No drainage or sinus tenderness. Throat: lips, mucosa, and tongue normal; teeth and gums normal Neck: no adenopathy, no carotid bruit, no JVD, supple, symmetrical, trachea midline and thyroid not enlarged, symmetric, no tenderness/mass/nodules Back: symmetric, no curvature. ROM normal. No CVA tenderness. Lungs: clear to auscultation bilaterally Breasts: normal appearance, no masses or tenderness Heart: regular rate and rhythm, S1, S2 normal, no murmur, click, rub or gallop Abdomen: soft, non-tender; bowel sounds normal; no masses,  no organomegaly Pelvic: cervix normal in appearance, external  genitalia normal, no adnexal masses or tenderness, no cervical motion tenderness, rectovaginal septum normal, uterus normal size, shape, and consistency, vagina normal without discharge and string visible Extremities: extremities normal, atraumatic, no cyanosis or edema Pulses: 2+ and symmetric Skin: Skin color, texture, turgor normal. No rashes or lesions Lymph nodes: Cervical, supraclavicular, and axillary nodes normal. Neurologic: Alert and oriented X 3, normal strength and tone. Normal symmetric reflexes. Normal coordination and gait    Assessment:    Healthy female exam.      Plan:     See After Visit Summary for Counseling Recommendations  Keep up a regular exercise program and make sure you are eating a healthy diet Try to eat 4 servings of dairy a day, or if you are lactose intolerant take a calcium with vitamin D daily.  Your vaccines are up to date.   Gas - discussed dietary changes, tirial of probiotic, making sure bowels are moving regularly.  Let me know if not improving.

## 2013-12-11 LAB — CYTOLOGY - PAP

## 2015-01-09 ENCOUNTER — Telehealth: Payer: Self-pay | Admitting: Family Medicine

## 2015-01-09 NOTE — Telephone Encounter (Signed)
Patient called request a nurse to call her back needs to ask a question about her shot records [phone]. Thanks

## 2015-01-10 NOTE — Telephone Encounter (Signed)
Called patient

## 2015-02-23 ENCOUNTER — Other Ambulatory Visit: Payer: Self-pay | Admitting: Family Medicine

## 2015-04-12 ENCOUNTER — Encounter: Payer: Self-pay | Admitting: Family Medicine

## 2015-04-12 ENCOUNTER — Ambulatory Visit (INDEPENDENT_AMBULATORY_CARE_PROVIDER_SITE_OTHER): Payer: BLUE CROSS/BLUE SHIELD | Admitting: Family Medicine

## 2015-04-12 VITALS — BP 129/82 | HR 92 | Wt 187.0 lb

## 2015-04-12 DIAGNOSIS — R21 Rash and other nonspecific skin eruption: Secondary | ICD-10-CM | POA: Diagnosis not present

## 2015-04-12 MED ORDER — IBUPROFEN 800 MG PO TABS: 800.0000 mg | ORAL_TABLET | Freq: Three times a day (TID) | ORAL | Status: DC | PRN

## 2015-04-12 MED ORDER — METRONIDAZOLE 0.75 % VA GEL: 1.0000 | VAGINAL | Status: DC

## 2015-04-12 MED ORDER — ACYCLOVIR 400 MG PO TABS: 400.0000 mg | ORAL_TABLET | Freq: Three times a day (TID) | ORAL | Status: DC

## 2015-04-12 NOTE — Addendum Note (Signed)
Addended by: Teddy Spike on: 04/12/2015 02:18 PM   Modules accepted: Orders

## 2015-04-12 NOTE — Progress Notes (Signed)
   Subjective:    Patient ID: [name], female    DOB: 12-Dec-1980, 35 y.o.   MRN: [id number]  HPI Rash on tops of her toes on both feet x 2 months.  No new lotions, soaps, perfumes, etc.  It is itchy.  She rash is in between the 3rd and 4th toes.  And between the 4th and 5th toes.  She reports it's very itchy at night. She has not had any cracks in the skin. She thinks it started after she had a pedicure.   Review of Systems     Objective:   Physical Exam  Constitutional: She is oriented to person, place, and time. She appears well-developed and well-nourished.  HENT:  Head: Normocephalic and atraumatic.  Eyes: Conjunctivae and EOM are normal.  Cardiovascular: Normal rate.   Pulmonary/Chest: Effort normal.  Neurological: She is alert and oriented to person, place, and time.  Skin: Skin is dry. No pallor.  She has some dry scaling skin 23rd and fourth toes and the fourth and fifth toes on both feet.  Psychiatric: She has a normal mood and affect. Her behavior is normal.  Vitals reviewed.         Assessment & Plan:  Rash-KOH skin scraping performed today. Will call for results once available. Suspect tinea pedis.

## 2015-04-15 LAB — FUNGAL STAIN

## 2015-04-23 ENCOUNTER — Ambulatory Visit: Payer: BLUE CROSS/BLUE SHIELD | Admitting: Family Medicine

## 2015-05-03 ENCOUNTER — Encounter: Payer: BLUE CROSS/BLUE SHIELD | Admitting: Family Medicine

## 2015-05-03 ENCOUNTER — Ambulatory Visit (INDEPENDENT_AMBULATORY_CARE_PROVIDER_SITE_OTHER): Payer: BLUE CROSS/BLUE SHIELD | Admitting: Family Medicine

## 2015-05-03 ENCOUNTER — Encounter: Payer: Self-pay | Admitting: Family Medicine

## 2015-05-03 ENCOUNTER — Other Ambulatory Visit (HOSPITAL_COMMUNITY)
Admission: RE | Admit: 2015-05-03 | Discharge: 2015-05-03 | Disposition: A | Discharge status: Home / Self Care | Payer: BLUE CROSS/BLUE SHIELD | Source: Ambulatory Visit | Attending: Family Medicine | Admitting: Family Medicine

## 2015-05-03 VITALS — BP 131/82 | HR 92 | Wt 185.0 lb

## 2015-05-03 DIAGNOSIS — R7301 Impaired fasting glucose: Secondary | ICD-10-CM

## 2015-05-03 DIAGNOSIS — Z114 Encounter for screening for human immunodeficiency virus [HIV]: Secondary | ICD-10-CM | POA: Diagnosis not present

## 2015-05-03 DIAGNOSIS — R319 Hematuria, unspecified: Secondary | ICD-10-CM

## 2015-05-03 DIAGNOSIS — R51 Headache: Secondary | ICD-10-CM

## 2015-05-03 DIAGNOSIS — R519 Headache, unspecified: Secondary | ICD-10-CM

## 2015-05-03 DIAGNOSIS — R112 Nausea with vomiting, unspecified: Secondary | ICD-10-CM

## 2015-05-03 DIAGNOSIS — Z01419 Encounter for gynecological examination (general) (routine) without abnormal findings: Secondary | ICD-10-CM

## 2015-05-03 DIAGNOSIS — R0789 Other chest pain: Secondary | ICD-10-CM

## 2015-05-03 DIAGNOSIS — R350 Frequency of micturition: Secondary | ICD-10-CM | POA: Diagnosis not present

## 2015-05-03 DIAGNOSIS — Z1151 Encounter for screening for human papillomavirus (HPV): Secondary | ICD-10-CM | POA: Diagnosis present

## 2015-05-03 DIAGNOSIS — R1013 Epigastric pain: Secondary | ICD-10-CM

## 2015-05-03 DIAGNOSIS — R252 Cramp and spasm: Secondary | ICD-10-CM

## 2015-05-03 DIAGNOSIS — G4700 Insomnia, unspecified: Secondary | ICD-10-CM

## 2015-05-03 LAB — POCT URINALYSIS DIPSTICK
Bilirubin, UA: NEGATIVE
GLUCOSE UA: NEGATIVE
Ketones, UA: NEGATIVE
Leukocytes, UA: NEGATIVE
NITRITE UA: NEGATIVE
Protein, UA: NEGATIVE
UROBILINOGEN UA: 2
pH, UA: 6

## 2015-05-03 LAB — POCT GLYCOSYLATED HEMOGLOBIN (HGB A1C): Hemoglobin A1C: 5.9

## 2015-05-03 MED ORDER — PANTOPRAZOLE SODIUM 40 MG PO TBEC: 40.0000 mg | DELAYED_RELEASE_TABLET | Freq: Every day | ORAL | Status: DC

## 2015-05-03 MED ORDER — PREDNISONE 10 MG PO TABS: ORAL_TABLET | ORAL | Status: DC

## 2015-05-03 MED ORDER — CYCLOBENZAPRINE HCL 10 MG PO TABS: 10.0000 mg | ORAL_TABLET | Freq: Every evening | ORAL | Status: DC | PRN

## 2015-05-03 NOTE — Patient Instructions (Addendum)
Please stop all Aleve, ibuprofen, Excedrin, and aspirin products. I think this could be causing some of the chest pain as well as the nausea. We will schedule you for an ultrasound for your gallbladder.  Going to send over the prednisone to try to break her headache cycle. Make sure to take it with food and water so it doesn't upset her stomach.   Try YouTube : Ladora Daniel for sleep.  Can try 5mg  of melatonin as well about 1 hour before bedtime.

## 2015-05-03 NOTE — Progress Notes (Signed)
Subjective:     [name] is a 35 y.o. female and is here for a comprehensive physical exam. The patient reports problems - Complains of urinary frequency for the last several weeks. She says she's even waking up to urinate. This is a little bit unusual for her. No recent history urinary tract infections. No hematuria or dysuria. No fevers or chills.   Also complains of chest that has been occurring on and off for the last couple of months. She says her last episode was last Wednesday. In fact that morning she actually took 2 aspirins and a Gas-X. It did seem to ease off a little bit but then came back that evening. That when she's really stressed and overwhelmed she'll start to get chest pain. She says it mostly occurs right in the center of her chest. She denies any heartburn or belching or GERD.  She also complains of nausea. Will come in episodes on average about 3-4 times a week. She said when she eats she'll immediately vomited back up. It does seem to occur more with things that like fried chicken. She occasionally will get some epigastric discomfort with it but not every time. Again she denies any heartburn type symptoms and no diarrhea with the episodes. She had actually tried omeprazole in the past and it did not provide any relief. She's never had an evaluation of her gallbladder done before. Next  She also complains of insomnia and not sleeping well. This is been going on for several months. She was previously working in the [employer]rap[employer], occupational and now is doing a different job. Evidently she had something happened that was stressful. She did not go into details but says ever since then she's not been sleeping well. She's been relying on Aleve PM to help her and to help with her back pain. But then she wakes up with a morning headache and takes 2 Excedrin every morning. She's been doing this for months.  Social History   Social History  . Marital Status: Single    Spouse Name: N/A  .  Number of Children: N/A  . Years of Education: N/A   Occupational History  . Not on file.   Social History Main Topics  . Smoking status: Never Smoker   . Smokeless tobacco: Never Used  . Alcohol Use: No  . Drug Use: No  . Sexual Activity:    Partners: Male   Other Topics Concern  . Not on file   Social History Narrative   Health Maintenance  Topic Date Due  . HIV Screening  02/26/1993  . INFLUENZA VACCINE  08/06/2015  . PAP SMEAR  12/07/2016  . TETANUS/TDAP  12/17/2020    The following portions of the patient's history were reviewed and updated as appropriate: allergies, current medications, past family history, past medical history, past social history, past surgical history and problem list.  Review of Systems Pertinent items noted in HPI and remainder of comprehensive ROS otherwise negative.   Objective:    BP 131/82 mmHg  Pulse 92  Wt 185 lb (83.915 kg)  SpO2 100% General appearance: alert, cooperative and appears stated age Head: Normocephalic, without obvious abnormality, atraumatic Eyes: conj clear, EOMI, PEERLA Ears: normal TM's and external ear canals both ears Nose: Nares normal. Septum midline. Mucosa normal. No drainage or sinus tenderness. Throat: lips, mucosa, and tongue normal; teeth and gums normal Neck: no adenopathy, no carotid bruit, no JVD, supple, symmetrical, trachea midline and thyroid not enlarged, symmetric, no  tenderness/mass/nodules Back: symmetric, no curvature. ROM normal. No CVA tenderness. Lungs: clear to auscultation bilaterally Breasts: normal appearance, no masses or tenderness Heart: regular rate and rhythm, S1, S2 normal, no murmur, click, rub or gallop Abdomen: soft, non-tender; bowel sounds normal; no masses,  no organomegaly Pelvic: cervix normal in appearance, external genitalia normal, no adnexal masses or tenderness, no cervical motion tenderness, rectovaginal septum normal, uterus normal size, shape, and consistency, vagina  normal without discharge and visualized string Extremities: extremities normal, atraumatic, no cyanosis or edema Pulses: 2+ and symmetric Skin: Skin color, texture, turgor normal. No rashes or lesions Lymph nodes: Cervical, supraclavicular, and axillary nodes normal. Neurologic: Alert and oriented X 3, normal strength and tone. Normal symmetric reflexes. Normal coordination and gait    Assessment:    Healthy female exam.      Plan:     See After Visit Summary for Counseling Recommendations    Keep up a regular exercise program and make sure you are eating a healthy diet Try to eat 4 servings of dairy a day, or if you are lactose intolerant take a calcium with vitamin D daily.  Your vaccines are up to date.   Urinary frequency-we'll check urinalysis today. Also like to check her renal function since she's been taking what sounds like a lot of Aleve Excedrin and aspirin.  Atypical chest pain-could be gastritis versus esophagitis and she has been taking a lot of NSAIDs. But also could be certainly stress related since it seems to happen most when she feels overwhelmed and stressed. We will have her stop all NSAIDs for now and put her on a PPI. Did do an EKG today as well as will check blood work to rule out anemia and thyroid disease. EKG shows rate of 86 bpm, normal sinus rhythm. No acute ST-T wave changes.  Nausea-again could be from chronic NSAID use. Also consider gallbladder. We'll set her up for ultrasound of the gallbladder for further evaluation and will go ahead and place her on a PPI for now.  Insomnia - sounds like her recent insomnia is definitely stress related. We discussed doing some meditation type therapy and recommended some specific limitations on YouTube. Also recommend a trial of melatonin.  IFG - A1C of 5.8. Will recheck in 6 mo.

## 2015-05-04 LAB — URINALYSIS, MICROSCOPIC ONLY
BACTERIA UA: NONE SEEN [HPF]
CASTS: NONE SEEN [LPF]
WBC, UA: NONE SEEN WBC/HPF (ref <=5)
YEAST: NONE SEEN [HPF]

## 2015-05-07 LAB — CYTOLOGY - PAP

## 2015-05-09 ENCOUNTER — Ambulatory Visit (INDEPENDENT_AMBULATORY_CARE_PROVIDER_SITE_OTHER): Payer: BLUE CROSS/BLUE SHIELD

## 2015-05-09 DIAGNOSIS — R932 Abnormal findings on diagnostic imaging of liver and biliary tract: Secondary | ICD-10-CM

## 2015-05-09 DIAGNOSIS — R0789 Other chest pain: Secondary | ICD-10-CM

## 2015-05-09 DIAGNOSIS — R112 Nausea with vomiting, unspecified: Secondary | ICD-10-CM

## 2015-05-09 NOTE — Progress Notes (Signed)
Quick Note:  Call patient: Your Pap smear is normal. Repeat in 5 years. ______ 

## 2015-05-10 LAB — LIPID PANEL
CHOL/HDL RATIO: 1.9 ratio (ref <=5.0)
CHOLESTEROL: 142 mg/dL (ref 125–200)
HDL: 75 mg/dL (ref >=46)
LDL Cholesterol: 51 mg/dL (ref <130)
Triglycerides: 81 mg/dL (ref <150)
VLDL: 16 mg/dL (ref <30)

## 2015-05-10 LAB — CBC WITH DIFFERENTIAL/PLATELET
BASOS ABS: 0 {cells}/uL (ref 0–200)
Basophils Relative: 0 %
EOS PCT: 1 %
Eosinophils Absolute: 96 cells/uL (ref 15–500)
HEMATOCRIT: 38.9 % (ref 35.0–45.0)
HEMOGLOBIN: 12.8 g/dL (ref 11.7–15.5)
LYMPHS ABS: 4512 {cells}/uL — AB (ref 850–3900)
Lymphocytes Relative: 47 %
MCH: 29 pg (ref 27.0–33.0)
MCHC: 32.9 g/dL (ref 32.0–36.0)
MCV: 88 fL (ref 80.0–100.0)
MONO ABS: 864 {cells}/uL (ref 200–950)
MPV: 9.1 fL (ref 7.5–12.5)
Monocytes Relative: 9 %
NEUTROS ABS: 4128 {cells}/uL (ref 1500–7800)
NEUTROS PCT: 43 %
Platelets: 426 10*3/uL — ABNORMAL HIGH (ref 140–400)
RBC: 4.42 MIL/uL (ref 3.80–5.10)
RDW: 14.8 % (ref 11.0–15.0)
WBC: 9.6 10*3/uL (ref 3.8–10.8)

## 2015-05-10 LAB — COMPLETE METABOLIC PANEL WITH GFR
ALBUMIN: 4.3 g/dL (ref 3.6–5.1)
ALK PHOS: 58 U/L (ref 33–115)
ALT: 10 U/L (ref 6–29)
AST: 12 U/L (ref 10–30)
BILIRUBIN TOTAL: 0.4 mg/dL (ref 0.2–1.2)
BUN: 14 mg/dL (ref 7–25)
CALCIUM: 9.3 mg/dL (ref 8.6–10.2)
CO2: 26 mmol/L (ref 20–31)
Chloride: 102 mmol/L (ref 98–110)
Creat: 0.73 mg/dL (ref 0.50–1.10)
Glucose, Bld: 98 mg/dL (ref 65–99)
POTASSIUM: 3.8 mmol/L (ref 3.5–5.3)
SODIUM: 139 mmol/L (ref 135–146)
TOTAL PROTEIN: 7.5 g/dL (ref 6.1–8.1)

## 2015-05-10 LAB — HIV ANTIBODY (ROUTINE TESTING W REFLEX): HIV: NONREACTIVE

## 2015-05-10 LAB — LIPASE: Lipase: 13 U/L (ref 7–60)

## 2015-05-10 LAB — AMYLASE: Amylase: 46 U/L (ref 0–105)

## 2015-05-10 LAB — FERRITIN: FERRITIN: 17 ng/mL (ref 10–154)

## 2015-05-13 ENCOUNTER — Institutional Professional Consult (permissible substitution): Payer: BLUE CROSS/BLUE SHIELD | Admitting: Family Medicine

## 2015-05-21 ENCOUNTER — Encounter: Payer: Self-pay | Admitting: *Deleted

## 2015-05-22 ENCOUNTER — Institutional Professional Consult (permissible substitution): Payer: BLUE CROSS/BLUE SHIELD | Admitting: Family Medicine

## 2015-05-29 ENCOUNTER — Institutional Professional Consult (permissible substitution): Payer: BLUE CROSS/BLUE SHIELD | Admitting: Family Medicine

## 2015-05-31 ENCOUNTER — Encounter: Payer: Self-pay | Admitting: Family Medicine

## 2015-05-31 ENCOUNTER — Ambulatory Visit (INDEPENDENT_AMBULATORY_CARE_PROVIDER_SITE_OTHER): Payer: BLUE CROSS/BLUE SHIELD | Admitting: Family Medicine

## 2015-05-31 ENCOUNTER — Institutional Professional Consult (permissible substitution): Payer: BLUE CROSS/BLUE SHIELD | Admitting: Family Medicine

## 2015-05-31 ENCOUNTER — Ambulatory Visit (INDEPENDENT_AMBULATORY_CARE_PROVIDER_SITE_OTHER): Payer: BLUE CROSS/BLUE SHIELD

## 2015-05-31 VITALS — BP 133/81 | HR 84 | Wt 181.0 lb

## 2015-05-31 DIAGNOSIS — G43009 Migraine without aura, not intractable, without status migrainosus: Secondary | ICD-10-CM | POA: Diagnosis not present

## 2015-05-31 DIAGNOSIS — G8929 Other chronic pain: Secondary | ICD-10-CM

## 2015-05-31 DIAGNOSIS — M545 Low back pain, unspecified: Secondary | ICD-10-CM | POA: Insufficient documentation

## 2015-05-31 DIAGNOSIS — K7689 Other specified diseases of liver: Secondary | ICD-10-CM | POA: Diagnosis not present

## 2015-05-31 DIAGNOSIS — G4700 Insomnia, unspecified: Secondary | ICD-10-CM | POA: Diagnosis not present

## 2015-05-31 MED ORDER — TOPIRAMATE 25 MG PO CPSP: ORAL_CAPSULE | ORAL | Status: DC

## 2015-05-31 MED ORDER — NAPROXEN 500 MG PO TABS: 500.0000 mg | ORAL_TABLET | Freq: Two times a day (BID) | ORAL | Status: DC

## 2015-05-31 MED ORDER — SUMATRIPTAN SUCCINATE 100 MG PO TABS: 100.0000 mg | ORAL_TABLET | ORAL | Status: DC | PRN

## 2015-05-31 MED ORDER — CYCLOBENZAPRINE HCL 10 MG PO TABS: 5.0000 mg | ORAL_TABLET | Freq: Three times a day (TID) | ORAL | Status: DC | PRN

## 2015-05-31 NOTE — Patient Instructions (Signed)
Thank you for coming in today. Get xray and schedule PT.  Return in 2-4 weeks.  Take naproxen for pain as needed.  Use flexeril mostly at bedtime for muscle spasms.  Use a heating pad.  TENS UNIT: This is helpful for muscle pain and spasm.   Search and Purchase a TENS 7000 2nd edition at www.tenspros.com. It should be less than $30.     TENS unit instructions: Do not shower or bathe with the unit on Turn the unit off before removing electrodes or batteries If the electrodes lose stickiness add a drop of water to the electrodes after they are disconnected from the unit and place on plastic sheet. If you continued to have difficulty, call the TENS unit company to purchase more electrodes. Do not apply lotion on the skin area prior to use. Make sure the skin is clean and dry as this will help prolong the life of the electrodes. After use, always check skin for unusual red areas, rash or other skin difficulties. If there are any skin problems, does not apply electrodes to the same area. Never remove the electrodes from the unit by pulling the wires. Do not use the TENS unit or electrodes other than as directed. Do not change electrode placement without consultating your therapist or physician. Keep 2 fingers with between each electrode. Wear time ratio is 2:1, on to off times.    For example on for 30 minutes off for 15 minutes and then on for 30 minutes off for 15 minutes   Lumbosacral Strain Lumbosacral strain is a strain of any of the parts that make up your lumbosacral vertebrae. Your lumbosacral vertebrae are the bones that make up the lower third of your backbone. Your lumbosacral vertebrae are held together by muscles and tough, fibrous tissue (ligaments).  CAUSES  A sudden blow to your back can cause lumbosacral strain. Also, anything that causes an excessive stretch of the muscles in the low back can cause this strain. This is typically seen when people exert themselves  strenuously, fall, lift heavy objects, bend, or crouch repeatedly. RISK FACTORS  Physically demanding work.  Participation in pushing or pulling sports or sports that require a sudden twist of the back (tennis, golf, baseball).  Weight lifting.  Excessive lower back curvature.  Forward-tilted pelvis.  Weak back or abdominal muscles or both.  Tight hamstrings. SIGNS AND SYMPTOMS  Lumbosacral strain may cause pain in the area of your injury or pain that moves (radiates) down your leg.  DIAGNOSIS Your health care provider can often diagnose lumbosacral strain through a physical exam. In some cases, you may need tests such as X-ray exams.  TREATMENT  Treatment for your lower back injury depends on many factors that your clinician will have to evaluate. However, most treatment will include the use of anti-inflammatory medicines. HOME CARE INSTRUCTIONS   Avoid hard physical activities (tennis, racquetball, waterskiing) if you are not in proper physical condition for it. This may aggravate or create problems.  If you have a back problem, avoid sports requiring sudden body movements. Swimming and walking are generally safer activities.  Maintain good posture.  Maintain a healthy weight.  For acute conditions, you may put ice on the injured area.  Put ice in a plastic bag.  Place a towel between your skin and the bag.  Leave the ice on for 20 minutes, 2-3 times a day.  When the low back starts healing, stretching and strengthening exercises may be recommended. [place]  IF:  Your back pain is getting worse.  You experience severe back pain not relieved with medicines. SEEK IMMEDIATE MEDICAL CARE IF:   You have numbness, tingling, weakness, or problems with the use of your arms or legs.  There is a change in bowel or bladder control.  You have increasing pain in any area of the body, including your belly (abdomen).  You notice shortness of breath, dizziness, or  feel faint.  You feel sick to your stomach (nauseous), are throwing up (vomiting), or become sweaty.  You notice discoloration of your toes or legs, or your feet get very cold. MAKE SURE YOU:   Understand these instructions.  Will watch your condition.  Will get help right away if you are not doing well or get worse.   This information is not intended to replace advice given to you by your health care provider. Make sure you discuss any questions you have with your health care provider.   Document Released: 10/01/2004 Document Revised: 01/12/2014 Document Reviewed: 08/10/2012 Elsevier Interactive Patient Education [employer].

## 2015-05-31 NOTE — Progress Notes (Signed)
Subjective:    CC: Migraines  HPI: F.U migraines - Patient is not currently on prophylaxis. She is having about 3-4 headaches per day. She has never taken prophylaxis before. Her headaches are mostly frontal and temporal and she describes them as throbbing. She does not get an aura with them. She does get nauseated at times.   Insomnia-it seems to actually be a little bit better compared to last visit. It sounded like her sleep was stress related. She did try the meditation in the valerian root and has really been helping her sleep.  She was also noted to have a liver cyst on her recent imaging test and had some questions about that.   Past medical history, Surgical history, Family history not pertinant except as noted below, Social history, Allergies, and medications have been entered into the medical record, reviewed, and corrections made.   Review of Systems: No fevers, chills, night sweats, weight loss, chest pain, or shortness of breath.   Objective:    General: Well Developed, well nourished, and in no acute distress.  Neuro: Alert and oriented x3, extra-ocular muscles intact, sensation grossly intact.  HEENT: Normocephalic, atraumatic  Skin: Warm and dry, no rashes. Cardiac: Regular rate and rhythm, no murmurs rubs or gallops, no lower extremity edema.  Respiratory: Clear to auscultation bilaterally. Not using accessory muscles, speaking in full sentences.   Impression and Recommendations:   Migraine headaches without aura-uncontrolled. Discussed options. We can put her on Topamax. Will taper. Discussed potential side effects. Follow-up in 8 weeks. We'll also start a rescue medication: Imitrex. Also one about potential side effects. Call if any problems.  Insomnia-not resolved but improved with meditation and valerian root. Continue to work on meditation especially since it really seems to be helping her.  Liver cyst-radiology had recommended follow-up MRI. She is okay with  doing this but prefers a morning appointment.
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
## 2015-06-04 ENCOUNTER — Other Ambulatory Visit: Payer: Self-pay | Admitting: Family Medicine

## 2015-06-04 DIAGNOSIS — K7689 Other specified diseases of liver: Secondary | ICD-10-CM

## 2015-06-04 NOTE — Progress Notes (Signed)
Order changed per Imaging request.

## 2015-06-04 NOTE — Progress Notes (Signed)
Quick Note:  Xray is pretty normal. ______

## 2015-06-12 ENCOUNTER — Other Ambulatory Visit: Payer: Self-pay | Admitting: Family Medicine

## 2015-06-20 ENCOUNTER — Ambulatory Visit (INDEPENDENT_AMBULATORY_CARE_PROVIDER_SITE_OTHER): Payer: BLUE CROSS/BLUE SHIELD | Admitting: Family Medicine

## 2015-06-20 DIAGNOSIS — Z5329 Procedure and treatment not carried out because of patient's decision for other reasons: Secondary | ICD-10-CM

## 2015-06-21 ENCOUNTER — Ambulatory Visit: Payer: BLUE CROSS/BLUE SHIELD | Admitting: Family Medicine

## 2015-06-21 NOTE — Progress Notes (Signed)
No show. F/u PRN

## 2015-06-28 ENCOUNTER — Telehealth: Payer: Self-pay | Admitting: Family Medicine

## 2015-06-28 NOTE — Telephone Encounter (Signed)
-----   Message from Katha Hamming sent at 06/28/2015  2:13 PM EDT ----- Regarding: MRI Jeanmarie Hubert:  Here is another order that has not been scheduled.  We cannot leave a VM and the patient called [place] on 06/06/15 and said she would call us back.  Thanks, Hoyle Sauer

## 2015-06-28 NOTE — Telephone Encounter (Signed)
Attempted to contact Pt, no answer and no VM available. MRI authorization valid: 05/31/2015 - 07/29/2015.

## 2015-08-09 ENCOUNTER — Emergency Department
Admission: EM | Admit: 2015-08-09 | Discharge: 2015-08-09 | Disposition: A | Discharge status: Home / Self Care | Payer: BLUE CROSS/BLUE SHIELD | Source: Home / Self Care | Attending: Emergency Medicine | Admitting: Emergency Medicine

## 2015-08-09 ENCOUNTER — Encounter: Payer: Self-pay | Admitting: Emergency Medicine

## 2015-08-09 DIAGNOSIS — N390 Urinary tract infection, site not specified: Secondary | ICD-10-CM

## 2015-08-09 DIAGNOSIS — R3589 Other polyuria: Secondary | ICD-10-CM

## 2015-08-09 DIAGNOSIS — A084 Viral intestinal infection, unspecified: Secondary | ICD-10-CM

## 2015-08-09 DIAGNOSIS — R358 Other polyuria: Secondary | ICD-10-CM

## 2015-08-09 LAB — POCT URINALYSIS DIP (MANUAL ENTRY)
Bilirubin, UA: NEGATIVE
Glucose, UA: NEGATIVE
Ketones, POC UA: NEGATIVE
Leukocytes, UA: NEGATIVE
Nitrite, UA: NEGATIVE
Protein Ur, POC: NEGATIVE
Spec Grav, UA: 1.025 (ref 1.005–1.03)
Urobilinogen, UA: 0.2 (ref 0–1)
pH, UA: 7 (ref 5–8)

## 2015-08-09 MED ORDER — ONDANSETRON 4 MG PO TBDP
4.0000 mg | ORAL_TABLET | ORAL | Status: AC
  Administered 2015-08-09: 4 mg via ORAL

## 2015-08-09 MED ORDER — CEPHALEXIN 250 MG PO CAPS
250.0000 mg | ORAL_CAPSULE | Freq: Three times a day (TID) | ORAL | 0 refills | Status: AC
Start: 2015-08-09 — End: 2015-08-16

## 2015-08-09 MED ORDER — ONDANSETRON HCL 4 MG PO TABS: 4.0000 mg | ORAL_TABLET | Freq: Four times a day (QID) | ORAL | 0 refills | Status: DC

## 2015-08-09 NOTE — Discharge Instructions (Signed)
Zofran for nausea. Cephalexin, 3 times a day for 7 days-antibiotic. Drink plenty of fluids. Follow-up with your doctor if not better in 3 days, or go to emergency room if any severe worsening pain

## 2015-08-09 NOTE — ED Triage Notes (Signed)
Vomited x 2 today, low grade fever 99.9, dizzy, fatigue, vomiting has subsided, temp is normal, feels a little dizzy, says she has been experiencing some polyuria.

## 2015-08-09 NOTE — ED Provider Notes (Addendum)
[name]    CSN: [id number] Arrival date & time: 08/09/15  1505  First Provider Contact:  First MD Initiated Contact with Patient 08/09/15 1601         History   Chief Complaint Chief Complaint  Patient presents with  . Nausea    HPI [name] is a 35 y.o. female.   HPI This is a 35 y.o. female who presents today with UTI symptoms for 2 days.  + dysuria + frequency + urgency No hematuria No vaginal discharge Has low-grade fever but no chills. No rash. Mild suprapubic lower abdominal pain Positive nausea Vomited 1 earlier today. No blood. --She has since been able to tolerate small amounts of liquids and solids. No back pain Feels fatigued, but no lightheadedness or syncope. She denies chance of pregnancy.-LMP 07/30/2015. She states she has an IUD and she denies chance of pregnancy. Has tried over-the-counter measures without improvement.  No chest pain or shortness of breath. No change of bowel habits.  Past Medical History:  Diagnosis Date  . Herpes simplex without mention of complication    daily acyclovir  . Vaginal yeast infection    recurrent    Patient Active Problem List   Diagnosis Date Noted  . Lumbago 05/31/2015  . IFG (impaired fasting glucose) 05/03/2015  . BV (bacterial vaginosis) 02/29/2012  . COMMON MIGRAINE 05/23/2007  . MIGRAINE UNSP W/INTRACTBL W/O STATUS MIGRAINOSUS 04/21/2007  . DELAYED MENSES 02/09/2007  . CONSTIPATION NOS 11/01/2006  . GERD 09/08/2006    Past Surgical History:  Procedure Laterality Date  . CESAREAN SECTION      OB History    Gravida Para Term Preterm AB Living   1 1 1     1    SAB TAB Ectopic Multiple Live Births                   Home Medications    Prior to Admission medications   Medication Sig Start Date End Date Taking? Authorizing Provider  acyclovir (ZOVIRAX) 400 MG tablet Take 1 tablet (400 mg total) by mouth 3 (three) times daily. X 5 days. 04/12/15   Hali Marry, MD    cephALEXin (KEFLEX) 250 MG capsule Take 1 capsule (250 mg total) by mouth 3 (three) times daily. For 7 days 08/09/15 08/16/15  Jacqulyn Cane, MD  cyclobenzaprine (FLEXERIL) 10 MG tablet Take 0.5-1 tablets (5-10 mg total) by mouth 3 (three) times daily as needed for muscle spasms. 05/31/15   Gregor Hams, MD  ibuprofen (ADVIL,MOTRIN) 800 MG tablet Take 1 tablet (800 mg total) by mouth every 8 (eight) hours as needed. 04/12/15   Hali Marry, MD  metroNIDAZOLE (METROGEL) 0.75 % vaginal gel Place 1 Applicatorful vaginally 2 (two) times a week. 04/12/15   Hali Marry, MD  naproxen (NAPROSYN) 500 MG tablet TAKE 1 TABLET (500 MG TOTAL) BY MOUTH 2 (TWO) TIMES DAILY WITH A MEAL. 06/12/15   Gregor Hams, MD  ondansetron (ZOFRAN) 4 MG tablet Take 1 tablet (4 mg total) by mouth every 6 (six) hours. As needed for nausea or vomiting 08/09/15   Jacqulyn Cane, MD  pantoprazole (PROTONIX) 40 MG tablet Take 1 tablet (40 mg total) by mouth daily. 05/03/15   Hali Marry, MD  PARAGARD INTRAUTERINE COPPER IU by Intrauterine route.    Historical Provider, MD  SUMAtriptan (IMITREX) 100 MG tablet Take 1 tablet (100 mg total) by mouth every 2 (two) hours as needed for migraine. May repeat  in 2 hours if headache persists or recurs. 05/31/15   Hali Marry, MD  topiramate (TOPAMAX) 25 MG capsule 25mg  po QHS x 3 days, then increase to BID x 1 week. Then increase to 2 tabs po BID. 05/31/15   Hali Marry, MD    Family History Family History  Problem Relation Age of Onset  . Cancer Maternal Aunt     breast  . Cancer Maternal Aunt     breast    Social History Social History  Substance Use Topics  . Smoking status: Never Smoker  . Smokeless tobacco: Never Used  . Alcohol use No     Allergies   Latex; Amoxicillin; and Penicillins   Review of Systems Review of Systems  All other systems reviewed and are negative.    Physical Exam Triage Vital Signs ED Triage Vitals [08/09/15 1559]   Enc Vitals Group     BP 136/93     Pulse Rate 77     Resp      Temp 98.8 F (37.1 C)     Temp Source Oral     SpO2 97 %     Weight 187 lb (84.8 kg)     Height 5\' 3"  (1.6 m)     Head Circumference      Peak Flow      Pain Score      Pain Loc      Pain Edu?      Excl. in [place]?    No data found.   Updated Vital Signs BP 136/93 (BP Location: Left Arm)   Pulse 77   Temp 98.8 F (37.1 C) (Oral)   Ht 5\' 3"  (1.6 m)   Wt 187 lb (84.8 kg)   LMP 07/30/2015 (Exact Date)   SpO2 97%   BMI 33.13 kg/m   Visual Acuity Right Eye Distance:   Left Eye Distance:   Bilateral Distance:    Right Eye Near:   Left Eye Near:    Bilateral Near:     Physical Exam  Constitutional: She is oriented to person, place, and time. She appears well-developed and well-nourished. No distress.  HENT:  Mouth/Throat: Oropharynx is clear and moist.  Eyes: No scleral icterus.  Neck: Neck supple.  Cardiovascular: Normal rate and regular rhythm.   Pulmonary/Chest: Breath sounds normal. No respiratory distress.  Abdominal: Soft. She exhibits no mass. There is no hepatosplenomegaly. There is tenderness in the suprapubic area. There is no rebound, no guarding and no CVA tenderness.  Lymphadenopathy:    She has no cervical adenopathy.  Neurological: She is alert and oriented to person, place, and time.  Skin: Skin is warm and dry. She is not diaphoretic.  Psychiatric: She has a normal mood and affect.  Nursing note and vitals reviewed.    UC Treatments / Results  Labs (all labs ordered are listed, but only abnormal results are displayed) Labs Reviewed  POCT URINALYSIS DIP (MANUAL ENTRY) - Abnormal; Notable for the following:       Result Value   Clarity, UA cloudy (*)    Blood, UA small (*)    All other components within normal limits  URINE CULTURE   Narrative:    Performed at:  [facility], [id number]                , [place] [id number]    EKG  EKG  Interpretation None       Radiology No results found.  Procedures Procedures (including critical care time)  Medications Ordered in UC Medications  ondansetron (ZOFRAN-ODT) disintegrating tablet 4 mg (4 mg Oral Given 08/09/15 1621)     Initial Impression / Assessment and Plan / UC Course  I have reviewed the triage vital signs and the nursing notes.  Pertinent labs & imaging results that were available during my care of the patient were reviewed by me and considered in my medical decision making (see chart for details).  Clinical Course  Zofran 4 mg ODT given here in urgent care, and she tolerated well.--Nausea resolved. She was able to drink 4 ounces of water without problem.  Likely has UTI, less likely viral gastroenteritis. No evidence of dehydration. Nausea improved after Zofran ODT given here in urgent care.  Final Clinical Impressions(s) / UC Diagnoses   Final diagnoses:  Polyuria  Viral gastroenteritis  UTI (lower urinary tract infection)   Treatment options discussed, as well as risks, benefits, alternatives. Patient voiced understanding and agreement with the following plans: Rest, push fluids. Urine culture sent Prior to prescribing meds, I carefully reviewed her allergy history. She had listed that she was intolerant to penicillin, but after questioning, she states that she once had a vaginal discharge after taking penicillin. Denies hx of allergy to penicillin. An After Visit Summary was printed and given to the patient. Red flags discussed. Follow-up with your primary care doctor in 3 days if not improving, ER if symptoms become worse or any red flags. Precautions discussed. Red flags discussed. Questions invited and answered. Patient voiced understanding and agreement.   New Prescriptions Discharge Medication List as of 08/09/2015  4:25 PM    START taking these medications   Details  cephALEXin (KEFLEX) 250 MG capsule Take 1 capsule (250 mg total) by  mouth 3 (three) times daily. For 7 days, Starting Fri 08/09/2015, Until Fri 08/16/2015, Normal    ondansetron (ZOFRAN) 4 MG tablet Take 1 tablet (4 mg total) by mouth every 6 (six) hours. As needed for nausea or vomiting, Starting Fri 08/09/2015, Normal         Jacqulyn Cane, MD 08/11/15 1844     Jacqulyn Cane, MD 08/11/15 [phone]

## 2015-08-11 LAB — URINE CULTURE: Organism ID, Bacteria: NO GROWTH

## 2015-08-18 ENCOUNTER — Other Ambulatory Visit: Payer: Self-pay | Admitting: Family Medicine

## 2015-08-19 ENCOUNTER — Other Ambulatory Visit: Payer: Self-pay | Admitting: *Deleted

## 2015-09-10 ENCOUNTER — Ambulatory Visit (INDEPENDENT_AMBULATORY_CARE_PROVIDER_SITE_OTHER): Payer: BLUE CROSS/BLUE SHIELD

## 2015-09-10 DIAGNOSIS — K769 Liver disease, unspecified: Secondary | ICD-10-CM | POA: Diagnosis not present

## 2015-09-10 DIAGNOSIS — K7689 Other specified diseases of liver: Secondary | ICD-10-CM

## 2015-09-10 MED ORDER — GADOXETATE DISODIUM 0.25 MMOL/ML IV SOLN
8.0000 mL | Freq: Once | INTRAVENOUS | Status: AC | PRN
  Administered 2015-09-10: 8 mL via INTRAVENOUS

## 2015-09-12 ENCOUNTER — Telehealth: Payer: Self-pay | Admitting: Family Medicine

## 2015-09-12 MED ORDER — ONDANSETRON HCL 4 MG PO TABS: 4.0000 mg | ORAL_TABLET | Freq: Four times a day (QID) | ORAL | 0 refills | Status: DC

## 2015-09-12 NOTE — Telephone Encounter (Signed)
Rx sent for nausea per Pt request.

## 2016-07-01 ENCOUNTER — Other Ambulatory Visit: Payer: Self-pay | Admitting: Family Medicine

## 2016-07-22 ENCOUNTER — Other Ambulatory Visit: Payer: Self-pay | Admitting: Family Medicine

## 2016-07-23 ENCOUNTER — Ambulatory Visit (INDEPENDENT_AMBULATORY_CARE_PROVIDER_SITE_OTHER): Payer: No Typology Code available for payment source | Admitting: Family Medicine

## 2016-07-23 ENCOUNTER — Encounter: Payer: Self-pay | Admitting: Family Medicine

## 2016-07-23 VITALS — BP 129/92 | HR 83 | Ht 64.0 in | Wt 189.0 lb

## 2016-07-23 DIAGNOSIS — R21 Rash and other nonspecific skin eruption: Secondary | ICD-10-CM

## 2016-07-23 DIAGNOSIS — N920 Excessive and frequent menstruation with regular cycle: Secondary | ICD-10-CM

## 2016-07-23 MED ORDER — CLOBETASOL PROPIONATE 0.05 % EX CREA: 1.0000 "application " | TOPICAL_CREAM | Freq: Two times a day (BID) | CUTANEOUS | 0 refills | Status: DC

## 2016-07-23 MED ORDER — MEDROXYPROGESTERONE ACETATE 5 MG PO TABS: 5.0000 mg | ORAL_TABLET | Freq: Every day | ORAL | 0 refills | Status: DC

## 2016-07-23 NOTE — Patient Instructions (Addendum)
Call if rash is not better in 3 weeks.  W  e can always refer you to dermatology if needed.

## 2016-07-23 NOTE — Progress Notes (Signed)
Subjective:    Patient ID: [name], female    DOB: Apr 23, 1980, 36 y.o.   MRN: [id number]  HPI Patient comes in today complaining of persistent blisters on the bottoms of her feet.  She came back in about April of last year for a similar rash on her feet. At the time opted to tx  with over-the-counter Lamisil cream for 4-6 weeks. She says it really didn't improve at all. Now has been over a year with the rash  She is also having very heavy menstrual bleeding. She's actually been protruding consistently and heavily for about the last 3 weeks.She does have fairly regular periods.  Sometimes they are heavy but it's never last 3 weeks before. Her Pap smear is up-to-date.   Review of Systems  BP (!) 129/92   Pulse 83   Ht 5\' 4"  (1.626 m)   Wt 189 lb (85.7 kg)   BMI 32.44 kg/m     Allergies  Allergen Reactions  . Latex Rash  . Amoxicillin Other (See Comments)    REACTION: Pt states she always gets yeast infection. Denies history of allergic rash or urticaria or any generalized reaction   . Penicillins     Past Medical History:  Diagnosis Date  . Herpes simplex without mention of complication    daily acyclovir  . Vaginal yeast infection    recurrent    Past Surgical History:  Procedure Laterality Date  . CESAREAN SECTION      Social History   Social History  . Marital status: Single    Spouse name: N/A  . Number of children: N/A  . Years of education: N/A   Occupational History  . Not on file.   Social History Main Topics  . Smoking status: Never Smoker  . Smokeless tobacco: Never Used  . Alcohol use No  . Drug use: No  . Sexual activity: Yes    Partners: Male   Other Topics Concern  . Not on file   Social History Narrative  . No narrative on file    Family History  Problem Relation Age of Onset  . Cancer Maternal Aunt        breast  . Cancer Maternal Aunt        breast    Outpatient Encounter Prescriptions as of 07/23/2016  Medication Sig   . cyclobenzaprine (FLEXERIL) 10 MG tablet Take 0.5-1 tablets (5-10 mg total) by mouth 3 (three) times daily as needed for muscle spasms.  Marland Kitchen ibuprofen (ADVIL,MOTRIN) 800 MG tablet TAKE 1 TABLET (800 MG TOTAL) BY MOUTH EVERY 8 (EIGHT) HOURS AS NEEDED.  Marland Kitchen metroNIDAZOLE (METROGEL) 0.75 % vaginal gel PLACE 1 APPLICATORFUL VAGINALLY 2 (TWO) TIMES A WEEK.  . naproxen (NAPROSYN) 500 MG tablet TAKE 1 TABLET (500 MG TOTAL) BY MOUTH 2 (TWO) TIMES DAILY WITH A MEAL.  Marland Kitchen ondansetron (ZOFRAN) 4 MG tablet Take 1 tablet (4 mg total) by mouth every 6 (six) hours. As needed for nausea or vomiting  . pantoprazole (PROTONIX) 40 MG tablet Take 1 tablet (40 mg total) by mouth daily.  Marland Kitchen PARAGARD INTRAUTERINE COPPER IU by Intrauterine route.  . SUMAtriptan (IMITREX) 100 MG tablet Take 1 tablet (100 mg total) by mouth every 2 (two) hours as needed for migraine. May repeat in 2 hours if headache persists or recurs.  . topiramate (TOPAMAX) 25 MG capsule 25mg  po QHS x 3 days, then increase to BID x 1 week. Then increase to 2 tabs po BID.  Marland Kitchen  clobetasol cream (TEMOVATE) 3.47 % Apply 1 application topically 2 (two) times daily.  . medroxyPROGESTERone (PROVERA) 5 MG tablet Take 1-2 tablets (5-10 mg total) by mouth daily.  . [DISCONTINUED] acyclovir (ZOVIRAX) 400 MG tablet TAKE 1 TABLET (400 MG TOTAL) BY MOUTH 3 (THREE) TIMES DAILY. X 5 DAYS. (Patient not taking: Reported on 07/23/2016)   No facility-administered encounter medications on file as of 07/23/2016.        Objective:   Physical Exam  Constitutional: She is oriented to person, place, and time. She appears well-developed and well-nourished.  HENT:  Head: Normocephalic and atraumatic.  Eyes: Conjunctivae and EOM are normal.  Cardiovascular: Normal rate.   Pulmonary/Chest: Effort normal.  Neurological: She is alert and oriented to person, place, and time.  Skin: Skin is dry. Rash noted. No erythema. No pallor.  On her feet she has areas that are papular and have a   thick scale and a few areas that and is still like a large vesicle or bulla. No Erythema no cracking of the skin.  Psychiatric: She has a normal mood and affect. Her behavior is normal.  Vitals reviewed.      Assessment & Plan:  Rash on feet - She did not respond to topical Lamisil last year so also consider dyshidrotic eczema. Will treat with a topical steroid cream.  If not improving then will refer to Derm. She will call back.   Heavy periods - will start provera to stop the periods.  Schedule for [place].  Consider STD testing.

## 2016-07-29 ENCOUNTER — Ambulatory Visit (INDEPENDENT_AMBULATORY_CARE_PROVIDER_SITE_OTHER): Payer: No Typology Code available for payment source

## 2016-07-29 DIAGNOSIS — N920 Excessive and frequent menstruation with regular cycle: Secondary | ICD-10-CM

## 2016-07-29 DIAGNOSIS — D251 Intramural leiomyoma of uterus: Secondary | ICD-10-CM

## 2016-07-30 ENCOUNTER — Telehealth: Payer: Self-pay

## 2016-07-30 NOTE — Telephone Encounter (Signed)
Pt reports that she is low in energy due to loosing a lot of blood.  She was notified of her results and is ok with going to GYN for further treatment.  She also said that she currently have an IUD. Please advise.-EH/RMA

## 2016-07-31 MED ORDER — MEDROXYPROGESTERONE ACETATE 10 MG PO TABS: 10.0000 mg | ORAL_TABLET | Freq: Two times a day (BID) | ORAL | 1 refills | Status: DC

## 2016-07-31 NOTE — Telephone Encounter (Signed)
Did the Provera help or not?

## 2016-07-31 NOTE — Telephone Encounter (Signed)
Okay. Since we can't use estrogen will try increasing the progesterone. Some new prescription sent to pharmacy. She can take up to 20 mg twice a day which is almost quadruple what she is taking currently. We can see if that at least was the bleeding down. Perception sent to [facility].

## 2016-07-31 NOTE — Telephone Encounter (Signed)
Pt notified of recommendations. (Under [place] results) She is wants to know is there something she can take in the meantime that could slow the bleeding down. -EH/RMA

## 2016-07-31 NOTE — Telephone Encounter (Signed)
She doesn't feel that its helping. -EH/RMA

## 2016-07-31 NOTE — Addendum Note (Signed)
Addended by: Beatrice Lecher D on: 07/31/2016 05:58 PM   Modules accepted: Orders

## 2016-08-03 ENCOUNTER — Encounter: Payer: Self-pay | Admitting: Obstetrics & Gynecology

## 2016-08-03 ENCOUNTER — Encounter: Payer: Self-pay | Admitting: *Deleted

## 2016-08-03 ENCOUNTER — Ambulatory Visit (INDEPENDENT_AMBULATORY_CARE_PROVIDER_SITE_OTHER): Payer: No Typology Code available for payment source | Admitting: Obstetrics & Gynecology

## 2016-08-03 ENCOUNTER — Telehealth: Payer: Self-pay | Admitting: Family Medicine

## 2016-08-03 VITALS — BP 127/84 | HR 98 | Resp 16 | Ht 65.0 in | Wt 184.0 lb

## 2016-08-03 DIAGNOSIS — N938 Other specified abnormal uterine and vaginal bleeding: Secondary | ICD-10-CM | POA: Diagnosis not present

## 2016-08-03 DIAGNOSIS — Z30433 Encounter for removal and reinsertion of intrauterine contraceptive device: Secondary | ICD-10-CM

## 2016-08-03 DIAGNOSIS — Z3202 Encounter for pregnancy test, result negative: Secondary | ICD-10-CM | POA: Diagnosis not present

## 2016-08-03 LAB — CBC
HCT: 37.3 % (ref 35.0–45.0)
Hemoglobin: 11.8 g/dL (ref 11.7–15.5)
MCH: 28.5 pg (ref 27.0–33.0)
MCHC: 31.6 g/dL — AB (ref 32.0–36.0)
MCV: 90.1 fL (ref 80.0–100.0)
MPV: 8.9 fL (ref 7.5–12.5)
Platelets: 460 10*3/uL — ABNORMAL HIGH (ref 140–400)
RBC: 4.14 MIL/uL (ref 3.80–5.10)
RDW: 14.3 % (ref 11.0–15.0)
WBC: 8.7 10*3/uL (ref 3.8–10.8)

## 2016-08-03 LAB — POCT URINE PREGNANCY: PREG TEST UR: NEGATIVE

## 2016-08-03 MED ORDER — LEVONORGESTREL 20 MCG/24HR IU IUD
INTRAUTERINE_SYSTEM | Freq: Once | INTRAUTERINE | Status: AC
  Administered 2016-08-03: 15:00:00 via INTRAUTERINE

## 2016-08-03 MED ORDER — IBUPROFEN 800 MG PO TABS: 800.0000 mg | ORAL_TABLET | Freq: Three times a day (TID) | ORAL | 0 refills | Status: DC | PRN

## 2016-08-03 NOTE — Progress Notes (Signed)
   Subjective:    Patient ID: [name], female    DOB: [birth year], 36 y.o.   MRN: [id number]  HPI  36 yo female c/o bleeding for 3 weeks.  Pt has known fibroids and a Paragard IUD in place.  Pt feels weak from the blood loss.  Pt had CBC ordered but didn't go for the blood draw.  Pt denies cramping or pain in pelvis.  She has some upper abdominal pain.  No N/V/D.  Pt has not seen in many years.  She has been getting her pap smears ar her PCP.  Review of Systems  Constitutional: Negative.   Respiratory: Negative.   Cardiovascular: Negative.   Gastrointestinal: Negative.   Genitourinary: Positive for menstrual problem, pelvic pain and vaginal discharge.       Objective:   Physical Exam  Constitutional: She is oriented to person, place, and time. She appears well-developed and well-nourished. No distress.  HENT:  Head: Normocephalic and atraumatic.  Eyes: Conjunctivae are normal.  Pulmonary/Chest: Effort normal.  Abdominal: Soft. Bowel sounds are normal. She exhibits no distension. There is no tenderness.  Genitourinary:  Genitourinary Comments: Nml ext gent Vagina pink nml ruga Cervix-very anterior, IUD strings seen Uterus enlarge c/w [place]   Musculoskeletal: She exhibits no edema.  Neurological: She is alert and oriented to person, place, and time.  Skin: Skin is warm and dry.  Psychiatric: She has a normal mood and affect.  Vitals reviewed.  Vitals:   08/03/16 1402  BP: 127/84  Pulse: 98  Resp: 16  Weight: 184 lb (83.5 kg)  Height: 5\' 5"  (1.651 m)    Assessment & Plan:  36 yo female with 3+weeks of bleeding  1-TSH, CBC 2-Known fibroids. Report reviewed.  I cm growth in 5 years in overall uterine measurements. 3-UPT neg 4-Will r/o endometrial pathology with endometrial biopsy 5-Provera 10 mg bid for 7 days then daily for 7 days 6-Remove Paragard and replace with Mirena.    GYNECOLOGY CLINIC PROCEDURE NOTE  [name] is a 36 y.o. G1P1001 here for  Paragard removal due to menorrhagia and replace with Mirena.  IUD Removal and Reinsertion  Patient identified, informed consent performed. Discussed risks of irregular bleeding, cramping, infection, malpositioning or misplacement of the IUD outside the uterus which may require further procedures. Time out was performed. Speculum placed in the vagina. Cervix visualized. Cleaned with Betadine x 2. Grasped anteriorly with a single tooth tenaculum. The strings of the IUD were grasped and pulled using ring forceps. The IUD was successfully removed in its entirety. Uterus sounded to 8.5 cm. Mirena IUD placed per manufacturer's recommendations. Strings trimmed to 3 cm. Tenaculum was removed, good hemostasis noted. Patient tolerated procedure well. Patient was given post-procedure instructions.  Patient was also asked to check IUD strings periodically and follow up in 6 weeks for IUD check.

## 2016-08-03 NOTE — Telephone Encounter (Signed)
Patient requesting a refill on her Ibuprofen. Please advise. Thanks!

## 2016-08-03 NOTE — Telephone Encounter (Signed)
Left message on vm

## 2016-08-03 NOTE — Telephone Encounter (Signed)
Rx sent Pt advised 

## 2016-08-04 LAB — TSH: TSH: 0.81 m[IU]/L

## 2016-08-10 ENCOUNTER — Telehealth: Payer: Self-pay | Admitting: *Deleted

## 2016-08-10 NOTE — Telephone Encounter (Signed)
Pt notified of normal labs per Dr Leggett. 

## 2016-08-10 NOTE — Telephone Encounter (Signed)
-----   Message from Guss Bunde, MD sent at 08/07/2016  7:53 PM EDT ----- [facility] is nml and not anemic.  Will notify patient of test results.

## 2016-08-13 ENCOUNTER — Encounter: Payer: No Typology Code available for payment source | Admitting: Obstetrics and Gynecology

## 2016-09-04 ENCOUNTER — Other Ambulatory Visit: Payer: Self-pay | Admitting: Family Medicine

## 2016-09-09 ENCOUNTER — Other Ambulatory Visit: Payer: Self-pay | Admitting: Family Medicine

## 2016-09-09 DIAGNOSIS — R21 Rash and other nonspecific skin eruption: Secondary | ICD-10-CM

## 2016-09-16 ENCOUNTER — Ambulatory Visit: Payer: No Typology Code available for payment source | Admitting: Obstetrics & Gynecology

## 2016-10-01 ENCOUNTER — Encounter (INDEPENDENT_AMBULATORY_CARE_PROVIDER_SITE_OTHER): Payer: Self-pay

## 2016-10-01 ENCOUNTER — Ambulatory Visit (INDEPENDENT_AMBULATORY_CARE_PROVIDER_SITE_OTHER): Payer: No Typology Code available for payment source | Admitting: Obstetrics & Gynecology

## 2016-10-01 ENCOUNTER — Encounter: Payer: Self-pay | Admitting: Obstetrics & Gynecology

## 2016-10-01 VITALS — BP 127/85 | HR 87 | Resp 16 | Ht 65.0 in | Wt 182.0 lb

## 2016-10-01 DIAGNOSIS — T8332XA Displacement of intrauterine contraceptive device, initial encounter: Secondary | ICD-10-CM

## 2016-10-01 DIAGNOSIS — Z975 Presence of (intrauterine) contraceptive device: Secondary | ICD-10-CM

## 2016-10-01 DIAGNOSIS — D251 Intramural leiomyoma of uterus: Secondary | ICD-10-CM

## 2016-10-06 NOTE — Progress Notes (Signed)
   Subjective:    Patient ID: [name], female    DOB: February 07, 1980, 36 y.o.   MRN: [id number]  HPI  Pt presents for folow up after Mirena IUD insertion.  Pt had scant menses at the very beginning of Septembre and again at the very end of September.  Pt not having any pain.  Pt unsure of the bleding she had was her menses.  I suggest a menstrual calendar to record bleeding and symptoms.  Pt understands that cycles can by every 21-35 days and be normal.   FINDINGS: Uterus  Measurements: 9.2 x 4.7 x 4.5 cm. Multiple uterine fibroids present, largest of which positioned near the lower uterine segment measuring 6.8 x 6.1 x 6.5 cm. Additional intramural fibroid present within the anterior uterine body measures 3.2 x 3.0 x 2.4 cm. Additional fundal fibroid measures 2.4 x 2.1 x 2.1 cm.  Endometrium  Thickness: 8.2 mm. No focal abnormality visualized. IUD positioned within the upper endometrial segment.  Right ovary  Not visualized.  No adnexal mass.  Left ovary  Measurements: 4.0 x 2.3 x 2.6 cm. Normal appearance/no adnexal mass.  Other findings  No abnormal free fluid.  IMPRESSION: 1. Fibroid uterus as above. 2. IUD in place, appropriately positioned within the upper endometrial canal. 3. Endometrium within normal limits measuring 8 mm without focal abnormality. 4. Normal sonographic appearance of the left ovary. 5. Nonvisualization of the right ovary.  No adnexal mass.   Review of Systems  Constitutional: Negative.   Cardiovascular: Negative.   Gastrointestinal: Negative.   Genitourinary: Negative.    Vitals:   10/01/16 1035  BP: 127/85  Pulse: 87  Resp: 16  Weight: 182 lb (82.6 kg)  Height: 5\' 5"  (1.651 m)        Objective:   Physical Exam  Constitutional: She is oriented to person, place, and time. She appears well-developed and well-nourished. No distress.  HENT:  Head: Normocephalic and atraumatic.  Eyes: Conjunctivae are normal.    Pulmonary/Chest: Effort normal.  Abdominal: Soft.  Genitourinary:  Genitourinary Comments: Cervix very anterior.   Scant blood in vault IUD strings not seen  Bedside US show IUD in correct place.  Musculoskeletal: She exhibits no edema.  Neurological: She is alert and oriented to person, place, and time.  Skin: Skin is warm and dry.  Psychiatric: She has a normal mood and affect.  Vitals reviewed.   Assessment & Plan:   36 yo female presents for follow up after IUD placenment  1.  IUD strings not present but appears in correct position on 2D [place] bedside 2.  Education surrounding fibroids, menstrual cycles. 3.  Maintain menstrual calendar 4.  RTC prn 5.  Rn to call next wek to make sure patient doesn't have any questions about her visit.  25 minutes spent face to face with patient with >50% counseling.

## 2016-10-09 ENCOUNTER — Telehealth: Payer: Self-pay | Admitting: *Deleted

## 2016-10-09 NOTE — Telephone Encounter (Signed)
-----   Message from Guss Bunde, MD sent at 10/06/2016  8:12 PM EDT ----- Can yo ucall and make sure patient doesn't have any questions about her last visit.  Just a check in to make sure we communicated well.    Thanks

## 2016-10-09 NOTE — Telephone Encounter (Signed)
Called pt to f/u concerning her last appt with Dr Gala Romney.  Just wanted to make sure patient was clear and what her game plan was.  Pt had no questions.

## 2016-10-26 ENCOUNTER — Other Ambulatory Visit: Payer: Self-pay | Admitting: Family Medicine

## 2016-11-02 IMAGING — US US ABDOMEN COMPLETE
1 series · 14 of 25 positions shown · non-contrast
Comparison: None.

CLINICAL DATA: Epigastric pain after eating for 2 months

EXAM:
ABDOMEN ULTRASOUND COMPLETE

[Series 1: us abdomen complete · 0.15mm/px · 14 of 79 slices shown]
[im 1/79]
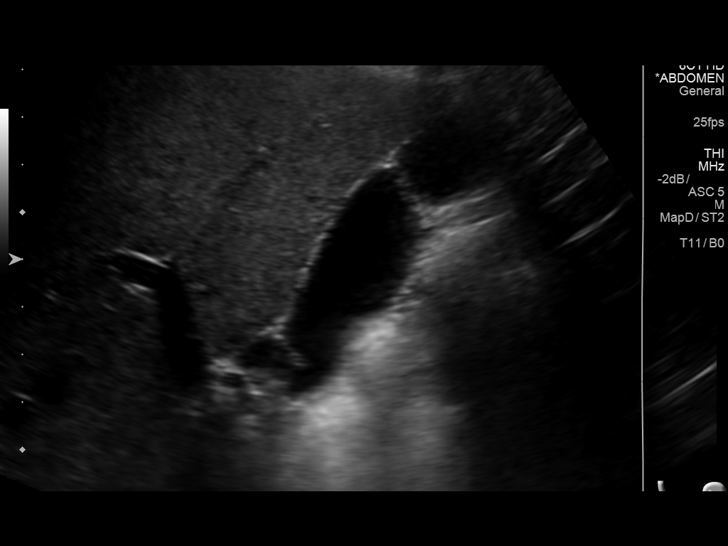
[im 7/79]
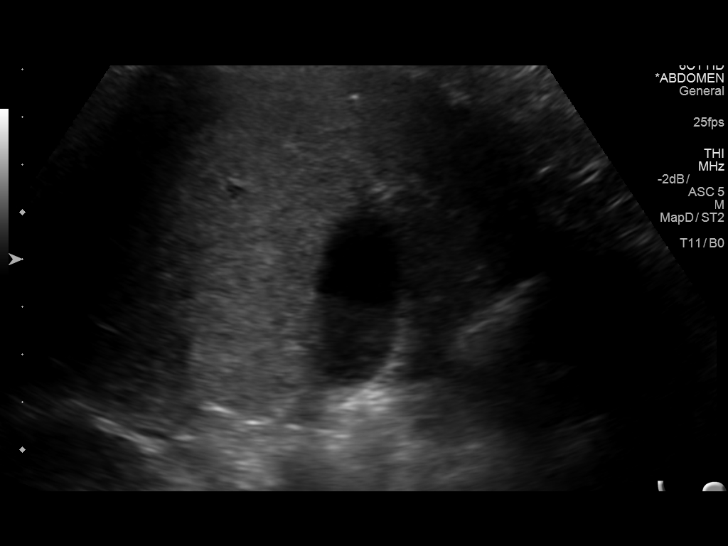
[im 14/79]
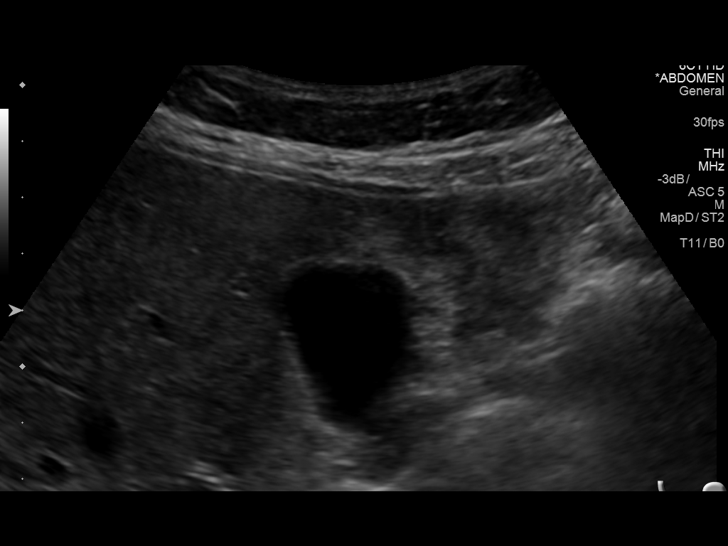
[im 20/79]
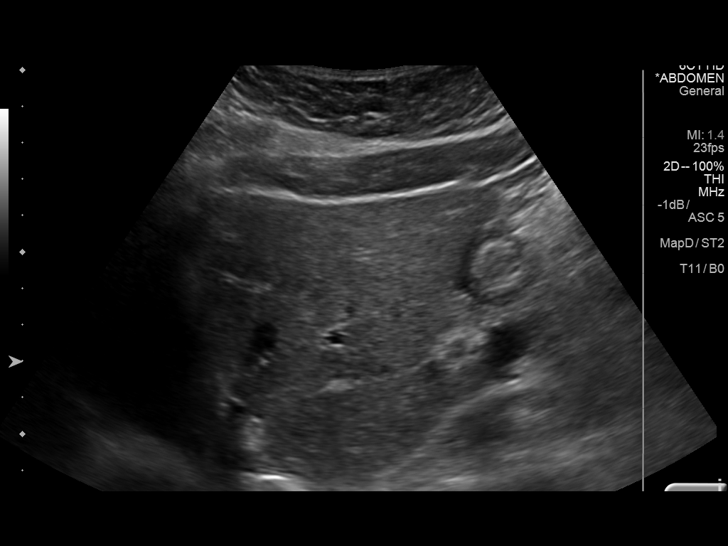
[im 27/79]
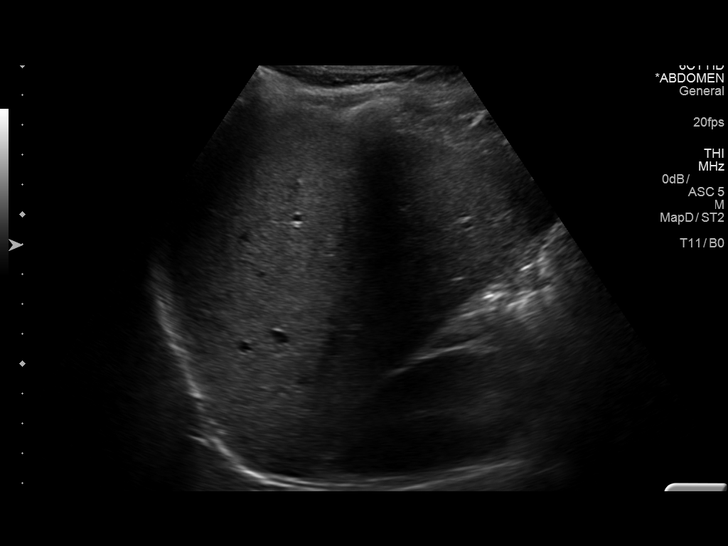
[im 30/79]
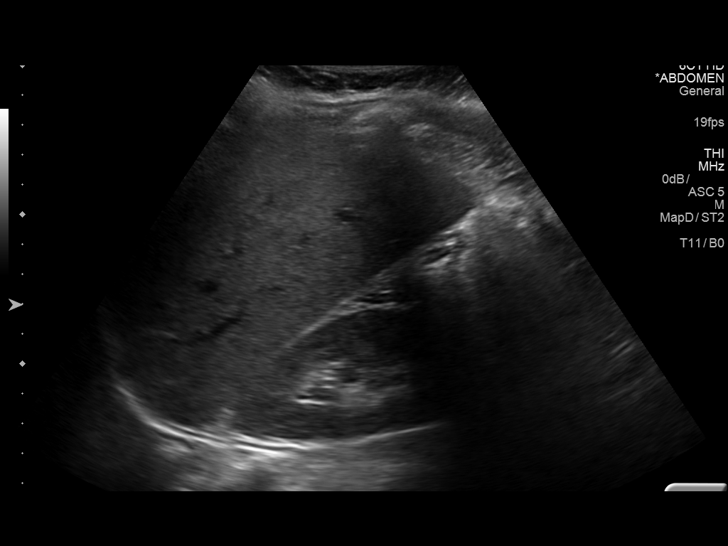
[im 36/79]
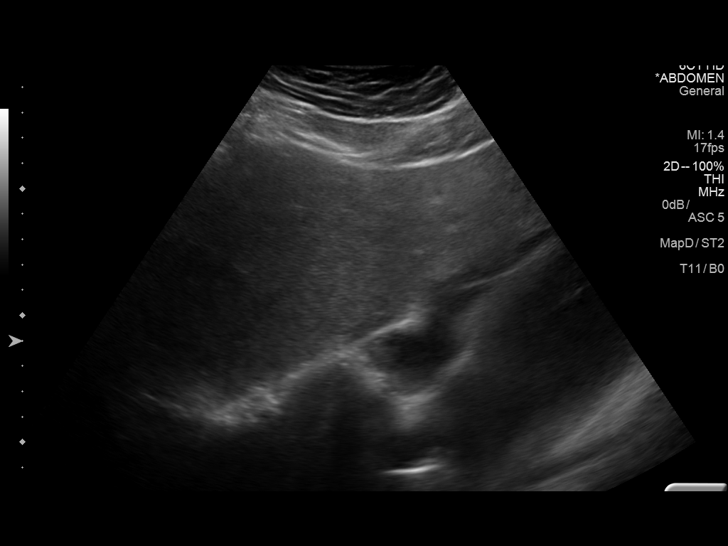
[im 43/79]
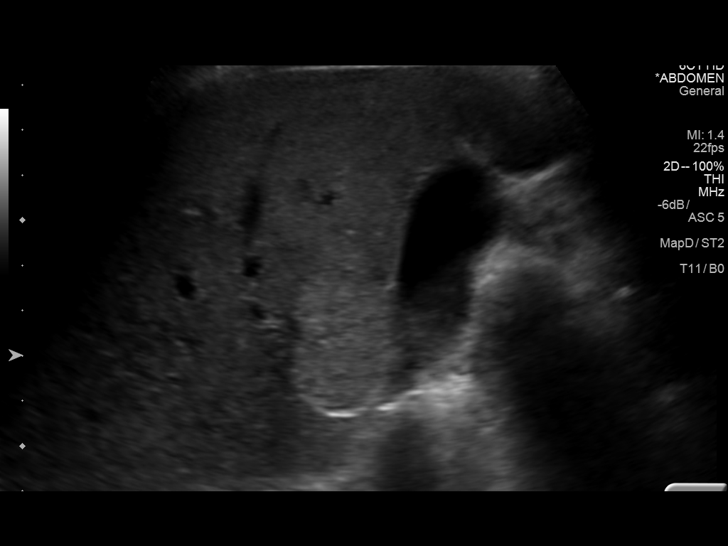
[im 49/79]
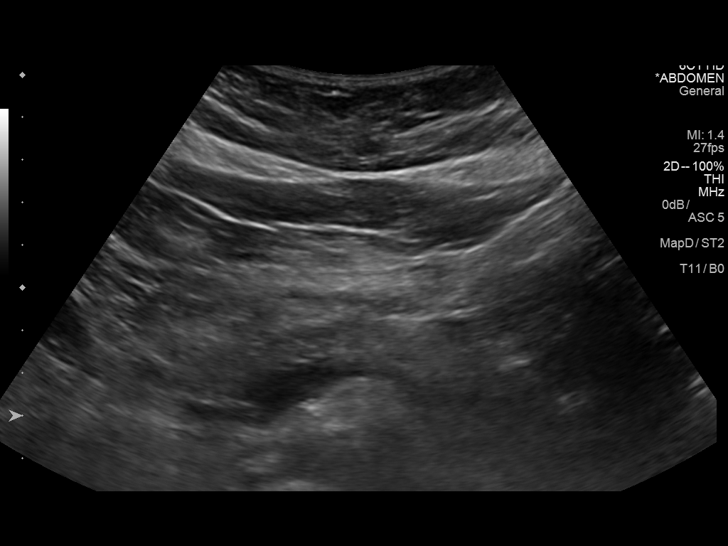
[im 53/79]
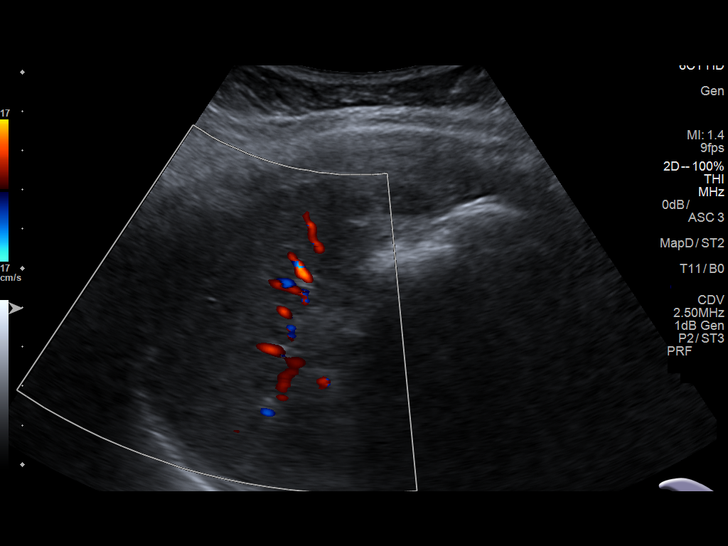
[im 59/79]
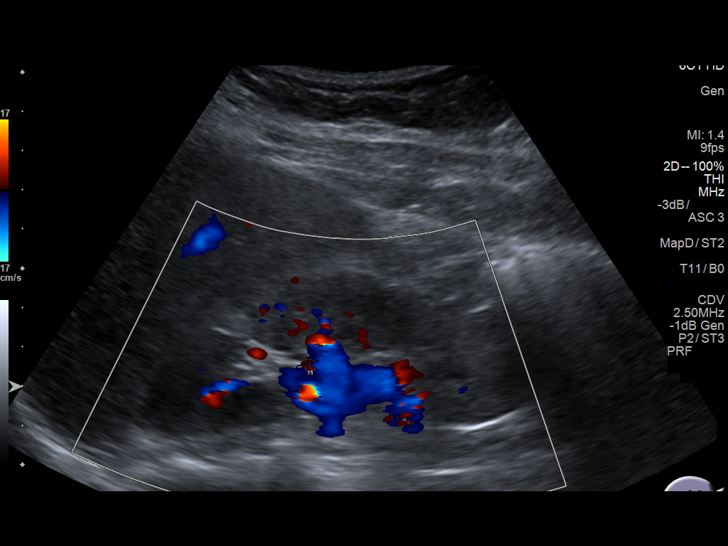
[im 66/79]
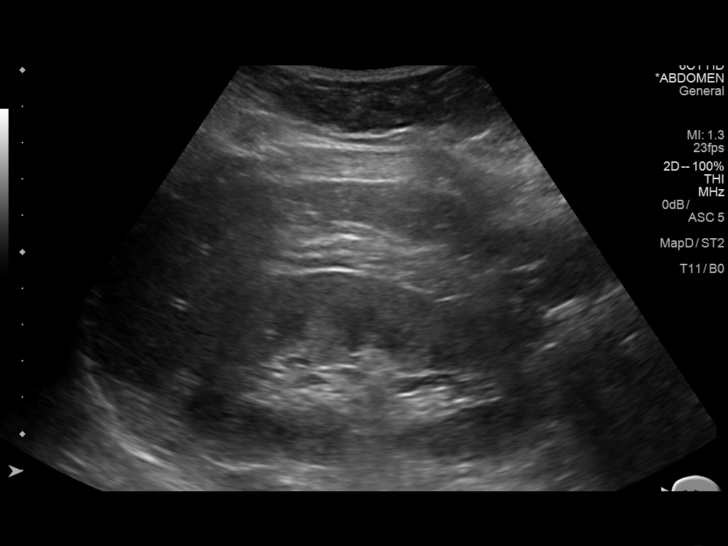
[im 72/79]
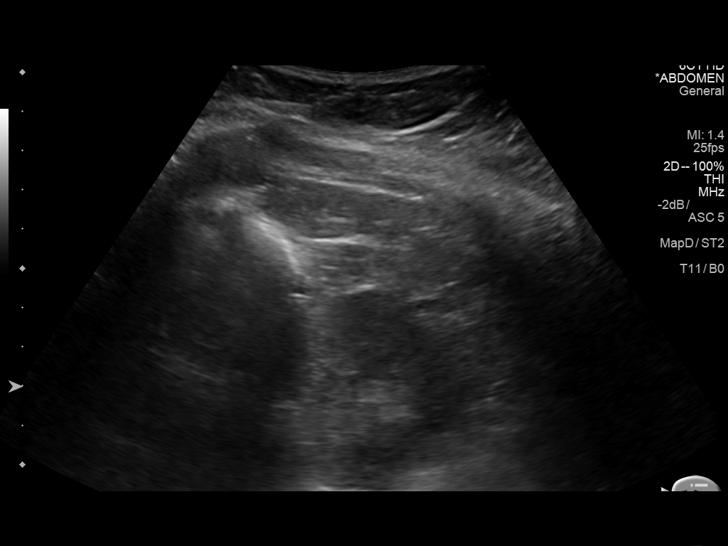
[im 79/79]
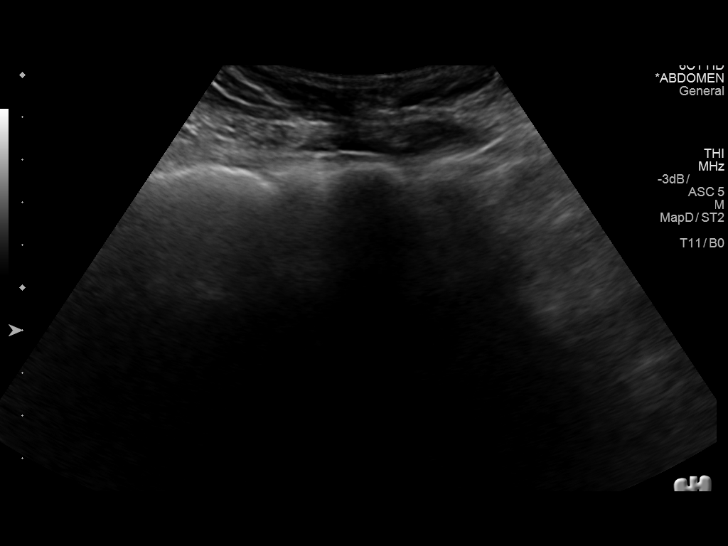

[14 of 25 positions shown; findings below may reference images not displayed]

FINDINGS: Gallbladder: No gallstones or wall thickening visualized. No
sonographic Murphy sign noted by sonographer.

Common bile duct: Diameter: 1.4 mm

Liver: There is a 3.9 x 2.3 x 2.1 cm hyperechoic lesion adjacent to
the gallbladder in the right lobe liver.

IVC: Limited visualization.

Pancreas: Limited visualization.

Spleen: Size and appearance within normal limits.

Right Kidney: Length: 11 cm. Echogenicity within normal limits. No
mass or hydronephrosis visualized.

Left Kidney: Length: 10.6 cm. Echogenicity within normal limits. No
mass or hydronephrosis visualized.

Abdominal aorta: No aneurysm visualized although evaluation is
limited due to bowel gas.

Other findings: None.
IMPRESSION: 3.9 cm hyperechoic lesion adjacent to the gallbladder in the right
lobe liver. This is nonspecific. If clinically indicated, further
evaluation with MRI of liver were three-phase liver CT may be
helpful.

## 2016-11-18 ENCOUNTER — Other Ambulatory Visit: Payer: Self-pay | Admitting: Family Medicine

## 2016-11-18 DIAGNOSIS — R21 Rash and other nonspecific skin eruption: Secondary | ICD-10-CM

## 2017-02-10 ENCOUNTER — Telehealth: Payer: Self-pay | Admitting: *Deleted

## 2017-02-10 DIAGNOSIS — N926 Irregular menstruation, unspecified: Secondary | ICD-10-CM

## 2017-02-10 NOTE — Telephone Encounter (Signed)
Pt called stating tha she has had irregular bleeding since getting her Mirena.  She does have known fibroids.  Per VO Dr Gala Romney ordered a pelvic U/S to check the status of the fibroid and then pt to f/u with Dr Gala Romney for results.

## 2017-02-15 ENCOUNTER — Ambulatory Visit (INDEPENDENT_AMBULATORY_CARE_PROVIDER_SITE_OTHER): Payer: No Typology Code available for payment source

## 2017-02-15 DIAGNOSIS — N926 Irregular menstruation, unspecified: Secondary | ICD-10-CM | POA: Diagnosis not present

## 2017-02-15 DIAGNOSIS — Y768 Miscellaneous obstetric and gynecological devices associated with adverse incidents, not elsewhere classified: Secondary | ICD-10-CM

## 2017-02-15 DIAGNOSIS — T8332XA Displacement of intrauterine contraceptive device, initial encounter: Secondary | ICD-10-CM

## 2017-02-16 ENCOUNTER — Telehealth: Payer: Self-pay | Admitting: *Deleted

## 2017-02-16 NOTE — Telephone Encounter (Signed)
LM on voicemail that her IUD is malpositioned and needs appt to have it removed and discuss her plan of care.

## 2017-02-16 NOTE — Telephone Encounter (Signed)
-----   Message from Guss Bunde, MD sent at 02/15/2017  8:44 PM EST ----- IUD is in endocervical canal.  Suggest coming to office for IUD removal and discuss plan of care.  RN to call.

## 2017-02-23 ENCOUNTER — Ambulatory Visit (INDEPENDENT_AMBULATORY_CARE_PROVIDER_SITE_OTHER): Payer: No Typology Code available for payment source | Admitting: Obstetrics & Gynecology

## 2017-02-23 ENCOUNTER — Encounter: Payer: Self-pay | Admitting: Obstetrics & Gynecology

## 2017-02-23 VITALS — BP 132/70 | HR 93 | Ht 63.0 in | Wt 182.0 lb

## 2017-02-23 DIAGNOSIS — Z30431 Encounter for routine checking of intrauterine contraceptive device: Secondary | ICD-10-CM

## 2017-02-23 DIAGNOSIS — T8332XA Displacement of intrauterine contraceptive device, initial encounter: Secondary | ICD-10-CM

## 2017-02-23 MED ORDER — MEDROXYPROGESTERONE ACETATE 150 MG/ML IM SUSP: 150.0000 mg | INTRAMUSCULAR | 3 refills | Status: DC

## 2017-02-23 NOTE — Progress Notes (Signed)
   Subjective:    Patient ID: [name], female    DOB: [birth year], 37 y.o.   MRN: [id number]  HPI  37 yo P63 female presents for plan of care.  Pt has been bleeding on her Mirena IUD.  It is noted to be malpositioned.  Pt has know fibroids that have not grown between [place].  Pt has had spotting on many to most days recently.  Would like to change methods.  Pt can't remember to take pills and does not want Nuva Ring.  Pt took depo many years ago and had bleeding.  Pt not interested in Nexplanon or another IUD.  However, given her options, she chooses this method.     Review of Systems  Constitutional: Negative.   Respiratory: Negative.   Gastrointestinal: Negative.   Endocrine: Negative.   Genitourinary: Positive for menstrual problem and vaginal bleeding. Negative for pelvic pain.       Objective:   Physical Exam  Constitutional: She is oriented to person, place, and time. She appears well-developed and well-nourished. No distress.  HENT:  Head: Normocephalic and atraumatic.  Eyes: Conjunctivae are normal.  Pulmonary/Chest: Effort normal.  Abdominal: Soft.  Musculoskeletal: She exhibits no edema.  Neurological: She is alert and oriented to person, place, and time.  Skin: Skin is warm and dry.  Psychiatric: She has a normal mood and affect.  Vitals reviewed.  Vitals:   02/23/17 1502  BP: 132/70  Pulse: 93  Weight: 182 lb (82.6 kg)  Height: 5\' 3"  (1.6 m)      Assessment & Plan:  37 yo female with malpositioned IUD and spotting  1.  Pt elects for depo provera.  She works in the [employer] and can have injection given to her at work.  Rx sent to her pharmacy.  Pt is worried bleeding.  Will leave IUD in situ for 2 months to hopefully minimize bleeding as she reaches her steady state on depo provera.   2.  RTC 2 months for IUD removal.    25 minutes spent face to face with patient with >50% counseling.

## 2017-02-23 NOTE — Progress Notes (Signed)
Pt had U/S on 2/11 and it showed IUD in abnormal position. Pt is unsure what BC she would like to use. Repeat BP 132/70

## 2017-02-26 ENCOUNTER — Other Ambulatory Visit: Payer: Self-pay | Admitting: Family Medicine

## 2017-04-21 ENCOUNTER — Other Ambulatory Visit: Payer: Self-pay | Admitting: Family Medicine

## 2017-05-18 ENCOUNTER — Other Ambulatory Visit: Payer: Self-pay | Admitting: Family Medicine

## 2017-05-18 DIAGNOSIS — R21 Rash and other nonspecific skin eruption: Secondary | ICD-10-CM

## 2017-06-07 ENCOUNTER — Encounter: Payer: Self-pay | Admitting: Obstetrics & Gynecology

## 2017-06-07 ENCOUNTER — Ambulatory Visit (INDEPENDENT_AMBULATORY_CARE_PROVIDER_SITE_OTHER): Payer: No Typology Code available for payment source | Admitting: Obstetrics & Gynecology

## 2017-06-07 VITALS — BP 130/85 | HR 102 | Resp 16 | Ht 65.0 in | Wt 189.0 lb

## 2017-06-07 DIAGNOSIS — Z538 Procedure and treatment not carried out for other reasons: Secondary | ICD-10-CM | POA: Diagnosis not present

## 2017-06-07 DIAGNOSIS — T8332XS Displacement of intrauterine contraceptive device, sequela: Secondary | ICD-10-CM

## 2017-06-07 DIAGNOSIS — Z975 Presence of (intrauterine) contraceptive device: Secondary | ICD-10-CM | POA: Diagnosis not present

## 2017-06-07 DIAGNOSIS — D259 Leiomyoma of uterus, unspecified: Secondary | ICD-10-CM | POA: Diagnosis not present

## 2017-06-07 MED ORDER — ALPRAZOLAM 0.5 MG PO TABS: ORAL_TABLET | ORAL | 0 refills | Status: DC

## 2017-06-07 MED ORDER — IBUPROFEN 600 MG PO TABS: ORAL_TABLET | ORAL | 1 refills | Status: DC

## 2017-06-07 NOTE — Progress Notes (Signed)
   Subjective:    Patient ID: [name], female    DOB: 06-10-1980, 37 y.o.   MRN: [id number]  HPI  Pt present for IUD removal.  It is in LUS. She is on depo provera for birth control.  She is having some spotting now after 2nd shot.  Pt has an anterior fibroid.    Review of Systems  Constitutional: Negative.   Respiratory: Negative.   Cardiovascular: Negative.   Gastrointestinal: Negative.        Objective:   Physical Exam  Constitutional: She is oriented to person, place, and time. She appears well-developed and well-nourished. No distress.  HENT:  Head: Normocephalic and atraumatic.  Eyes: Conjunctivae are normal.  Cardiovascular: Normal rate.  Pulmonary/Chest: Effort normal.  Abdominal: Soft. She exhibits no distension. There is no tenderness.  Genitourinary: Vagina normal.  Genitourinary Comments: Cervix closed, non tender, no strings   Musculoskeletal: She exhibits no edema.  Neurological: She is alert and oriented to person, place, and time.  Skin: Skin is warm and dry.  Psychiatric: She has a normal mood and affect.  Vitals reviewed.  Vitals:   06/07/17 1340  BP: 130/85  Pulse: (!) 102  Resp: 16  Weight: 189 lb (85.7 kg)  Height: 5\' 5"  (1.651 m)       Assessment & Plan:  37 yo female on depo provera with IUD in LUS for removal.  IUD Removal Patient identified, informed consent performed. Discussed risks of irregular bleeding, cramping, infection, malpositioning or misplacement of the IUD outside the uterus which may require further procedures. Time out was performed. Speculum placed in the vagina. Cervix visualized. Strings not visible.  Cytobrush used to reveal strings--not successful.  Curved and straight forceps were used to grasp strings--unsuccessful.  Cervix dilated with #1 yellow dilator and tried to grab strings--unsuccessful.  Pt uncomfortable and would like to stop procedure.  After patient was dressed we discussed other options--retry office  removal with cytotec and xanax or go to OR for further sedation. Pt opted for office removal with cytotec and xanax.  Pt scheduled later this month.    Bedside US shows IUD in LUS.    15 minutes spent face to face counseling about birth control.  IUD removal attempt is separate procedure.

## 2017-06-07 NOTE — Progress Notes (Signed)
   IUD in LUS

## 2017-06-14 ENCOUNTER — Other Ambulatory Visit: Payer: Self-pay | Admitting: *Deleted

## 2017-06-14 MED ORDER — MISOPROSTOL 200 MCG PO TABS: ORAL_TABLET | ORAL | 0 refills | Status: DC

## 2017-06-16 ENCOUNTER — Other Ambulatory Visit: Payer: Self-pay | Admitting: Obstetrics & Gynecology

## 2017-06-16 ENCOUNTER — Telehealth: Payer: Self-pay | Admitting: *Deleted

## 2017-06-16 MED ORDER — MEGESTROL ACETATE 40 MG PO TABS: 40.0000 mg | ORAL_TABLET | Freq: Two times a day (BID) | ORAL | 5 refills | Status: DC

## 2017-06-16 NOTE — Telephone Encounter (Signed)
Pt called stating that she has been bleeding for 3 weeks.  She is now changing her maxi pad every hour and it is full.  Spoke with Dr Gala Romney via phone who recommended Megace for a few days until her appt here on 06/22/17.  Pt notified and Dr Hulan Fray ordered the [facility].

## 2017-06-22 ENCOUNTER — Ambulatory Visit (INDEPENDENT_AMBULATORY_CARE_PROVIDER_SITE_OTHER): Payer: No Typology Code available for payment source | Admitting: Obstetrics & Gynecology

## 2017-06-22 ENCOUNTER — Encounter: Payer: Self-pay | Admitting: Obstetrics & Gynecology

## 2017-06-22 ENCOUNTER — Encounter (HOSPITAL_COMMUNITY): Payer: Self-pay

## 2017-06-22 VITALS — BP 146/95 | HR 103 | Resp 16 | Ht 65.0 in | Wt 188.0 lb

## 2017-06-22 DIAGNOSIS — N939 Abnormal uterine and vaginal bleeding, unspecified: Secondary | ICD-10-CM

## 2017-06-22 MED ORDER — ESTRADIOL 1 MG PO TABS: 1.0000 mg | ORAL_TABLET | Freq: Every day | ORAL | 0 refills | Status: DC

## 2017-06-22 NOTE — Patient Instructions (Signed)
Uterine Artery Embolization for Fibroids Uterine artery embolization is a nonsurgical treatment to shrink fibroids. A thin plastic tube (catheter) is used to inject material that blocks off the blood supply to the fibroid, which causes the fibroid to shrink. Tell a health care provider about:  Any allergies you have.  All medicines you are taking, including vitamins, herbs, eye drops, creams, and over-the-counter medicines.  Any problems you or family members have had with anesthetic medicines.  Any blood disorders you have.  Any surgeries you have had.  Any medical conditions you have. What are the risks?  Injury to the uterus from decreased blood supply  Infection.  Blood infection (septicemia).  Lack of menstrual periods (amenorrhea).  Death of tissue cells (necrosis) around your bladder or vulva.  Development of a hole between organs or from an organ to the surface of your skin (fistula).  Blood clot in the legs (deep vein thrombosis) or lung (pulmonary embolus). What happens before the procedure?  Ask your health care provider about changing or stopping your regular medicines.  Do not take aspirin or blood thinners (anticoagulants) for 1 week before the surgery or as directed by your health care provider.  Do not eat or drink anything for 8 hours before the surgery or as directed by your health care provider.  Empty your bladder before the procedure begins. What happens during the procedure?  An IV tube will be placed into one of your veins. This will be used to give you a sedative and pain medication (conscious sedation).  You will be given a medicine that numbs the area (local anesthetic).  A small cut will be made in your groin. A catheter is then inserted into the main artery of your leg.  The catheter will be guided through the artery to your uterus. A series of images will be taken while dye is injected through the catheter in your groin. X-rays are taken at  the same time. This is done to provide a road map of the blood supply to your uterus and fibroids.  Tiny plastic spheres, about the size of sand grains, will be injected through the catheter. Metal coils may be used to help block the artery. The particles will lodge in tiny branches of the uterine artery that supplies blood to the fibroids.  The procedure is repeated on the artery that supplies the other side of the uterus.  The catheter is then removed and pressure is held to stop any bleeding. No stitches are needed.  A dressing is then placed over the cut (incision). What happens after the procedure?  You will be taken to a recovery area where your progress will be monitored until you are awake, stable, and taking fluids well. If there are no other problems, you will then be moved to a regular hospital room.  You will be observed overnight in the hospital.  You will have cramping that should be controlled with pain medication. This information is not intended to replace advice given to you by your health care provider. Make sure you discuss any questions you have with your health care provider. Document Released: 03/09/2005 Document Revised: 05/30/2015 Document Reviewed: 07/07/2012 Elsevier Interactive Patient Education  [employer].

## 2017-06-22 NOTE — Progress Notes (Addendum)
   Subjective:    Patient ID: [name], female    DOB: 1980-05-05, 37 y.o.   MRN: [id number]  HPI  Patient presents for IUD removal.  She took cytotec and Xanax to help with cervical softness and anxiety.  Pt bleeding daily.    Review of Systems  Constitutional: Negative.   Gastrointestinal: Negative.   Genitourinary: Positive for vaginal bleeding.       Objective:   Physical Exam  Constitutional: She is oriented to person, place, and time. She appears well-developed and well-nourished. No distress.  HENT:  Head: Normocephalic and atraumatic.  Eyes: Conjunctivae are normal.  Cardiovascular: Normal rate.  Pulmonary/Chest: Effort normal.  Genitourinary: Vagina normal.  Genitourinary Comments: Blood in vault  Musculoskeletal: She exhibits no edema.  Neurological: She is alert and oriented to person, place, and time.  Skin: Skin is warm and dry.  Psychiatric: She has a normal mood and affect.  Vitals reviewed.  Vitals:   06/22/17 0903  BP: (!) 146/95  Pulse: (!) 103  Resp: 16  Weight: 188 lb (85.3 kg)  Height: 5\' 5"  (1.651 m)     Assessment & Plan:  37 yo female with IUD and bleeding  1-IUD attempted and unable to remove. 2-Schedule in OR for further cervical dilation 3-Megace not working to stop bleeding.  Esrace 1 mg daily for one week. 4- patient's ongoing bleeding repeat endometrial biopsy was done today  IUD Removal Patient identified, informed consent performed. Discussed risks of irregular bleeding, cramping, infection, malpositioning or misplacement of the IUD outside the uterus which may require further procedures. Time out was performed. Speculum placed in the vagina. Cervix visualized and rectus was grasped with a single-tooth tenaculum.  Ten cc of 1% lidocaine injected into the cervix and cervix gently dilated to emit the IUD hook.  Under US guidance the iud hook and curved Claiborne Billings was used to attempt IUD removal.  Anterior fibroid is distorting the canal  and IUD unable to be grasped.  Pt will need to go to OR for further dilation and possible operative hysteroscopy.   All instruments were removed from the vagina and good hemostasis was noted.    ENDOMETRIAL BIOPSY     The indications for endometrial biopsy were reviewed.   Risks of the biopsy including cramping, bleeding, infection, uterine perforation, inadequate specimen and need for additional procedures  were discussed. The patient states she understands and agrees to undergo procedure today. Consent was signed.  The 3 mm pipelle was introduced into the endometrial cavity without difficulty, and a moderate amount of tissue was obtained and sent to pathology. The instruments were removed from the patient's vagina. Minimal bleeding from the cervix was noted. The patient tolerated the procedure well. Routine post-procedure instructions were given to the patient.    Silas Sacramento, MD 06/24/2017

## 2017-06-24 ENCOUNTER — Other Ambulatory Visit: Payer: Self-pay

## 2017-06-24 ENCOUNTER — Telehealth: Payer: Self-pay | Admitting: *Deleted

## 2017-06-24 ENCOUNTER — Encounter (HOSPITAL_COMMUNITY): Payer: Self-pay | Admitting: *Deleted

## 2017-06-24 MED ORDER — MISOPROSTOL 200 MCG PO TABS: ORAL_TABLET | ORAL | 0 refills | Status: DC

## 2017-06-24 NOTE — Telephone Encounter (Signed)
-----   Message from Guss Bunde, MD sent at 06/24/2017  5:44 AM EDT ----- Can you prescribe her cytotec orally the night before her surgery.  400 mcg.  Thanks!

## 2017-06-24 NOTE — Telephone Encounter (Signed)
LM on voicemail that Dr Gala Romney was sending a [facility] for [facility] to [facility] for her to take PO the night prior to surgery.  Instructed pt to call office verifying she received the message.

## 2017-07-02 ENCOUNTER — Ambulatory Visit (HOSPITAL_COMMUNITY): Payer: No Typology Code available for payment source | Admitting: Anesthesiology

## 2017-07-02 ENCOUNTER — Encounter (HOSPITAL_COMMUNITY): Payer: Self-pay

## 2017-07-02 ENCOUNTER — Other Ambulatory Visit: Payer: Self-pay

## 2017-07-02 ENCOUNTER — Encounter (HOSPITAL_COMMUNITY)
Admission: RE | Disposition: A | Discharge status: Home / Self Care | Payer: Self-pay | Source: Ambulatory Visit | Attending: Obstetrics & Gynecology

## 2017-07-02 ENCOUNTER — Ambulatory Visit (HOSPITAL_COMMUNITY)
Admission: RE | Admit: 2017-07-02 | Discharge: 2017-07-02 | Disposition: A | Discharge status: Home / Self Care | Payer: No Typology Code available for payment source | Source: Ambulatory Visit | Attending: Obstetrics & Gynecology | Admitting: Obstetrics & Gynecology

## 2017-07-02 DIAGNOSIS — Y929 Unspecified place or not applicable: Secondary | ICD-10-CM | POA: Insufficient documentation

## 2017-07-02 DIAGNOSIS — Y768 Miscellaneous obstetric and gynecological devices associated with adverse incidents, not elsewhere classified: Secondary | ICD-10-CM | POA: Insufficient documentation

## 2017-07-02 DIAGNOSIS — T8332XD Displacement of intrauterine contraceptive device, subsequent encounter: Secondary | ICD-10-CM | POA: Diagnosis not present

## 2017-07-02 DIAGNOSIS — T8332XA Displacement of intrauterine contraceptive device, initial encounter: Secondary | ICD-10-CM | POA: Diagnosis present

## 2017-07-02 HISTORY — PX: HYSTEROSCOPY W/D&C: SHX1775

## 2017-07-02 HISTORY — PX: HYSTEROSCOPY: SHX211

## 2017-07-02 HISTORY — DX: Headache: R51

## 2017-07-02 HISTORY — DX: Headache, unspecified: R51.9

## 2017-07-02 HISTORY — PX: IUD REMOVAL: SHX5392

## 2017-07-02 LAB — ABO/RH: ABO/RH(D): O POS

## 2017-07-02 LAB — CBC
HCT: 39.1 % (ref 36.0–46.0)
Hemoglobin: 13.3 g/dL (ref 12.0–15.0)
MCH: 29.9 pg (ref 26.0–34.0)
MCHC: 34 g/dL (ref 30.0–36.0)
MCV: 87.9 fL (ref 78.0–100.0)
PLATELETS: 417 10*3/uL — AB (ref 150–400)
RBC: 4.45 MIL/uL (ref 3.87–5.11)
RDW: 13.7 % (ref 11.5–15.5)
WBC: 10.3 10*3/uL (ref 4.0–10.5)

## 2017-07-02 LAB — TYPE AND SCREEN
ABO/RH(D): O POS
Antibody Screen: NEGATIVE

## 2017-07-02 LAB — PREGNANCY, URINE: Preg Test, Ur: NEGATIVE

## 2017-07-02 SURGERY — DILATATION AND CURETTAGE /HYSTEROSCOPY
Anesthesia: General | Site: Vagina

## 2017-07-02 MED ORDER — LIDOCAINE 2% (20 MG/ML) 5 ML SYRINGE
INTRAMUSCULAR | Status: DC | PRN
  Administered 2017-07-02: 80 mg via INTRAVENOUS

## 2017-07-02 MED ORDER — KETOROLAC TROMETHAMINE 30 MG/ML IJ SOLN
INTRAMUSCULAR | Status: DC | PRN
  Administered 2017-07-02: 30 mg via INTRAVENOUS

## 2017-07-02 MED ORDER — ONDANSETRON HCL 4 MG/2ML IJ SOLN
INTRAMUSCULAR | Status: AC
  Filled 2017-07-02: qty 2

## 2017-07-02 MED ORDER — DEXAMETHASONE SODIUM PHOSPHATE 10 MG/ML IJ SOLN
INTRAMUSCULAR | Status: DC | PRN
  Administered 2017-07-02: 10 mg via INTRAVENOUS

## 2017-07-02 MED ORDER — DEXAMETHASONE SODIUM PHOSPHATE 10 MG/ML IJ SOLN
INTRAMUSCULAR | Status: AC
  Filled 2017-07-02: qty 1

## 2017-07-02 MED ORDER — PROMETHAZINE HCL 25 MG/ML IJ SOLN: 6.2500 mg | INTRAMUSCULAR | Status: DC | PRN

## 2017-07-02 MED ORDER — HYDROMORPHONE HCL 1 MG/ML IJ SOLN: 0.2500 mg | INTRAMUSCULAR | Status: DC | PRN

## 2017-07-02 MED ORDER — ESMOLOL HCL 100 MG/10ML IV SOLN
INTRAVENOUS | Status: DC | PRN
  Administered 2017-07-02: 20 mg via INTRAVENOUS
  Administered 2017-07-02: 30 mg via INTRAVENOUS

## 2017-07-02 MED ORDER — LIDOCAINE HCL (PF) 1 % IJ SOLN
INTRAMUSCULAR | Status: AC
  Filled 2017-07-02: qty 5

## 2017-07-02 MED ORDER — ACETAMINOPHEN 500 MG PO TABS
1000.0000 mg | ORAL_TABLET | Freq: Once | ORAL | Status: AC
  Administered 2017-07-02: 1000 mg via ORAL

## 2017-07-02 MED ORDER — LACTATED RINGERS IV SOLN
INTRAVENOUS | Status: DC
  Administered 2017-07-02: 125 mL/h via INTRAVENOUS

## 2017-07-02 MED ORDER — PROPOFOL 10 MG/ML IV BOLUS
INTRAVENOUS | Status: AC
  Filled 2017-07-02: qty 20

## 2017-07-02 MED ORDER — ACETAMINOPHEN 500 MG PO TABS
ORAL_TABLET | ORAL | Status: AC
  Filled 2017-07-02: qty 2

## 2017-07-02 MED ORDER — SODIUM CHLORIDE 0.9 % IR SOLN
Status: DC | PRN
  Administered 2017-07-02: 3000 mL

## 2017-07-02 MED ORDER — CELECOXIB 200 MG PO CAPS
200.0000 mg | ORAL_CAPSULE | Freq: Once | ORAL | Status: AC | PRN
  Administered 2017-07-02: 200 mg via ORAL

## 2017-07-02 MED ORDER — OXYCODONE HCL 5 MG/5ML PO SOLN: 5.0000 mg | Freq: Once | ORAL | Status: DC | PRN

## 2017-07-02 MED ORDER — ONDANSETRON HCL 4 MG/2ML IJ SOLN
INTRAMUSCULAR | Status: DC | PRN
  Administered 2017-07-02: 4 mg via INTRAVENOUS

## 2017-07-02 MED ORDER — FENTANYL CITRATE (PF) 100 MCG/2ML IJ SOLN
INTRAMUSCULAR | Status: AC
  Filled 2017-07-02: qty 2

## 2017-07-02 MED ORDER — CELECOXIB 200 MG PO CAPS
ORAL_CAPSULE | ORAL | Status: AC
  Filled 2017-07-02: qty 1

## 2017-07-02 MED ORDER — MIDAZOLAM HCL 2 MG/2ML IJ SOLN
INTRAMUSCULAR | Status: AC
  Filled 2017-07-02: qty 2

## 2017-07-02 MED ORDER — ACETAMINOPHEN 160 MG/5ML PO SOLN: 960.0000 mg | Freq: Once | ORAL | Status: AC

## 2017-07-02 MED ORDER — OXYCODONE HCL 5 MG PO TABS: 5.0000 mg | ORAL_TABLET | Freq: Once | ORAL | Status: DC | PRN

## 2017-07-02 MED ORDER — MIDAZOLAM HCL 2 MG/2ML IJ SOLN
INTRAMUSCULAR | Status: DC | PRN
  Administered 2017-07-02: 2 mg via INTRAVENOUS

## 2017-07-02 MED ORDER — SCOPOLAMINE 1 MG/3DAYS TD PT72
1.0000 | MEDICATED_PATCH | Freq: Once | TRANSDERMAL | Status: DC
  Administered 2017-07-02: 1.5 mg via TRANSDERMAL

## 2017-07-02 MED ORDER — PROPOFOL 10 MG/ML IV BOLUS
INTRAVENOUS | Status: DC | PRN
  Administered 2017-07-02: 200 mg via INTRAVENOUS

## 2017-07-02 MED ORDER — KETOROLAC TROMETHAMINE 30 MG/ML IJ SOLN
INTRAMUSCULAR | Status: AC
  Filled 2017-07-02: qty 1

## 2017-07-02 MED ORDER — FENTANYL CITRATE (PF) 100 MCG/2ML IJ SOLN
INTRAMUSCULAR | Status: DC | PRN
  Administered 2017-07-02 (×2): 25 ug via INTRAVENOUS
  Administered 2017-07-02: 50 ug via INTRAVENOUS

## 2017-07-02 MED ORDER — SCOPOLAMINE 1 MG/3DAYS TD PT72
MEDICATED_PATCH | TRANSDERMAL | Status: AC
  Filled 2017-07-02: qty 1

## 2017-07-02 MED ORDER — LIDOCAINE HCL 1 % IJ SOLN
INTRAMUSCULAR | Status: AC
  Filled 2017-07-02: qty 20

## 2017-07-02 SURGICAL SUPPLY — 18 items
BIPOLAR CUTTING LOOP 21FR (ELECTRODE)
CANISTER SUCT 3000ML PPV (MISCELLANEOUS) ×3 IMPLANT
CATH ROBINSON RED A/P 16FR (CATHETERS) ×3 IMPLANT
ELECT REM PT RETURN 9FT ADLT (ELECTROSURGICAL)
ELECTRODE REM PT RTRN 9FT ADLT (ELECTROSURGICAL) IMPLANT
GLOVE BIO SURGEON STRL SZ7 (GLOVE) ×3 IMPLANT
GLOVE BIOGEL PI IND STRL 7.0 (GLOVE) ×2 IMPLANT
GLOVE BIOGEL PI INDICATOR 7.0 (GLOVE) ×4
GLOVE ECLIPSE 7.0 STRL STRAW (GLOVE) ×3 IMPLANT
GOWN STRL REUS W/TWL LRG LVL3 (GOWN DISPOSABLE) ×6 IMPLANT
LOOP CUTTING BIPOLAR 21FR (ELECTRODE) IMPLANT
NEEDLE SPNL 22GX3.5 QUINCKE BK (NEEDLE) ×3 IMPLANT
PACK VAGINAL MINOR WOMEN LF (CUSTOM PROCEDURE TRAY) ×3 IMPLANT
PAD OB MATERNITY 4.3X12.25 (PERSONAL CARE ITEMS) ×3 IMPLANT
PAD PREP 24X48 CUFFED NSTRL (MISCELLANEOUS) ×3 IMPLANT
TOWEL OR 17X24 6PK STRL BLUE (TOWEL DISPOSABLE) ×6 IMPLANT
TUBING AQUILEX INFLOW (TUBING) ×3 IMPLANT
TUBING AQUILEX OUTFLOW (TUBING) ×3 IMPLANT

## 2017-07-02 NOTE — Discharge Instructions (Signed)
Post Anesthesia Home Care Instructions  Activity: Get plenty of rest for the remainder of the day. A responsible individual must stay with you for 24 hours following the procedure.  For the next 24 hours, DO NOT: -Drive a car -Paediatric nurse -Drink alcoholic beverages -Take any medication unless instructed by your physician -Make any legal decisions or sign important papers.  Meals: Start with liquid foods such as gelatin or soup. Progress to regular foods as tolerated. Avoid greasy, spicy, heavy foods. If nausea and/or vomiting occur, drink only clear liquids until the nausea and/or vomiting subsides. Call your physician if vomiting continues.  Special Instructions/Symptoms: Your throat may feel dry or sore from the anesthesia or the breathing tube placed in your throat during surgery. If this causes discomfort, gargle with warm salt water. The discomfort should disappear within 24 hours.  If you had a scopolamine patch placed behind your ear for the management of post- operative nausea and/or vomiting:  1. The medication in the patch is effective for 72 hours, after which it should be removed.  Wrap patch in a tissue and discard in the trash. Wash hands thoroughly with soap and water. 2. You may remove the patch earlier than 72 hours if you experience unpleasant side effects which may include dry mouth, dizziness or visual disturbances. 3. Avoid touching the patch. Wash your hands with soap and water after contact with the patch.   DISCHARGE INSTRUCTIONS: D&C / D&E The following instructions have been prepared to help you care for yourself upon your return home.   Personal hygiene:  Use sanitary pads for vaginal drainage, not tampons.  Shower the day after your procedure.  NO tub baths, pools or Jacuzzis for 2-3 weeks.  Wipe front to back after using the bathroom.  Activity and limitations:  Do NOT drive or operate any equipment for 24 hours. The effects of anesthesia are  still present and drowsiness may result.  Do NOT rest in bed all day.  Walking is encouraged.  Walk up and down stairs slowly.  You may resume your normal activity in one to two days or as indicated by your physician.  Sexual activity: NO intercourse for at least 2 weeks after the procedure, or as indicated by your physician.  Diet: Eat a light meal as desired this evening. You may resume your usual diet tomorrow.  Return to work: You may resume your work activities in one to two days or as indicated by your doctor.  What to expect after your surgery: Expect to have vaginal bleeding/discharge for 2-3 days and spotting for up to 10 days. It is not unusual to have soreness for up to 1-2 weeks. You may have a slight burning sensation when you urinate for the first day. Mild cramps may continue for a couple of days. You may have a regular period in 2-6 weeks.  Call your doctor for any of the following:  Excessive vaginal bleeding, saturating and changing one pad every hour.  Inability to urinate 6 hours after discharge from hospital.  Pain not relieved by pain medication.  Fever of 100.4 F or greater.  Unusual vaginal discharge or odor.   Call for an appointment:    Patients signature: ______________________  Nurses signature ________________________  Support person's signature_______________________   Hysteroscopy, Care After Refer to this sheet in the next few weeks. These instructions provide you with information on caring for yourself after your procedure. Your health care provider may also give you more specific instructions. Your  treatment has been planned according to current medical practices, but problems sometimes occur. Call your health care provider if you have any problems or questions after your procedure. What can I expect after the procedure? After your procedure, it is typical to have the following:  You may have some cramping. This normally lasts for a  couple days.  You may have bleeding. This can vary from light spotting for a few days to menstrual-like bleeding for 3-7 days.  Follow these instructions at home:  Rest for the first 1-2 days after the procedure.  Only take over-the-counter or prescription medicines as directed by your health care provider. Do not take aspirin. It can increase the chances of bleeding.  Take showers instead of baths for 2 weeks or as directed by your health care provider.  Do not drive for 24 hours or as directed.  Do not drink alcohol while taking pain medicine.  Do not use tampons, douche, or have sexual intercourse for 2 weeks or until your health care provider says it is okay.  Take your temperature twice a day for 4-5 days. Write it down each time.  Follow your health care provider's advice about diet, exercise, and lifting.  If you develop constipation, you may: ? Take a mild laxative if your health care provider approves. ? Add bran foods to your diet. ? Drink enough fluids to keep your urine clear or pale yellow.  Try to have someone with you or available to you for the first 24-48 hours, especially if you were given a general anesthetic.  Follow up with your health care provider as directed. Contact a health care provider if:  You feel dizzy or lightheaded.  You feel sick to your stomach (nauseous).  You have abnormal vaginal discharge.  You have a rash.  You have pain that is not controlled with medicine. Get help right away if:  You have bleeding that is heavier than a normal menstrual period.  You have a fever.  You have increasing cramps or pain, not controlled with medicine.  You have new belly (abdominal) pain.  You pass out.  You have pain in the tops of your shoulders (shoulder strap areas).  You have shortness of breath. This information is not intended to replace advice given to you by your health care provider. Make sure you discuss any questions you have  with your health care provider. Document Released: 10/12/2012 Document Revised: 05/30/2015 Document Reviewed: 07/21/2012 Elsevier Interactive Patient Education  2017 [employer].

## 2017-07-02 NOTE — Anesthesia Procedure Notes (Signed)
Procedure Name: LMA Insertion Date/Time: 07/02/2017 9:26 AM Performed by: Raenette Rover, CRNA Pre-anesthesia Checklist: Patient identified, Emergency Drugs available, Patient being monitored and Suction available Patient Re-evaluated:Patient Re-evaluated prior to induction Oxygen Delivery Method: Circle system utilized Preoxygenation: Pre-oxygenation with 100% oxygen Induction Type: IV induction Ventilation: Mask ventilation without difficulty LMA: LMA inserted LMA Size: 4.0 Number of attempts: 1 Placement Confirmation: positive ETCO2,  CO2 detector and breath sounds checked- equal and bilateral Tube secured with: Tape Dental Injury: Teeth and Oropharynx as per pre-operative assessment

## 2017-07-02 NOTE — Op Note (Signed)
Preoperative Diagnosis:  [name] is a 37 y.o. G2P2001 with embedded IUD  Postoperative Diagnosis:  [name] is a 37 y.o. G2P2001 with embedded IUD  Procedure:  Exam under anesthesia, cervical dilation, operative hysteroscopy and emoval or IUD  Surgeon:  Dr. Silas Sacramento  Anestheisa:  General  Complications:  None  Pathology:  None  Procedure:    Patient was taken to the operating room where general anesthesia was given.  The patient was placed in dorsal lithotomy position and prepared and draped in the normal sterile fashion.  A time out was performed.  Speculum placed in the vagina and the cervix visualized.  The anterior lip of the cervix was grasped with a single tooth tenaculum.  The cervix was dilated with Hegar dilators to a 6.5.  Polyp forcep was used to attempt IUD removal.  Hysteroscope was then introduced to visualize IUD.  Given its very anterior embedded position a forcep was introduced through the operative port of the hysteroscopy and IUD was grasped and moved slightly.  Hysteroscope was removed and a small curette was introduced in the uterus to help move the IUD further down the endometrial canal.  The hysteroscope was reintroduced and a forceps through the operative port was used to grab the IUD.  The IUD was successfully removed.  All instruments were removed from the vagina and patient taught the procedure well all counts are correct x2 and patient went to the recovery room in stable condition  Fluid deficit 955 cc.  Some fluid noted on floor and gown that leaked from the operative port.

## 2017-07-02 NOTE — H&P (Signed)
[name] is an 37 y.o. female with menometrorrhagia from IUD.  Endometiral biopsy showed endometrial polyp.  IUD unable to be removed in office due to patient tolerance of exam and anterior fibroid.     Past Medical History:  Diagnosis Date  . Headache   . Herpes simplex without mention of complication    daily acyclovir  . Vaginal yeast infection    recurrent    Past Surgical History:  Procedure Laterality Date  . BREAST SURGERY Bilateral    benign cysts  . CESAREAN SECTION     x 1  . WISDOM TOOTH EXTRACTION      Family History  Problem Relation Age of Onset  . Cancer Maternal Aunt        breast  . Cancer Maternal Aunt        breast    Social History:  reports that she has never smoked. She has never used smokeless tobacco. She reports that she does not drink alcohol or use drugs.  Allergies:  Allergies  Allergen Reactions  . Latex Rash  . Amoxicillin Other (See Comments)    REACTION: Pt states she always gets yeast infection. Denies history of allergic rash or urticaria or any generalized reaction   . Penicillins     No medications prior to admission.    Review of Systems  Constitutional: Negative.   Cardiovascular: Negative.   Gastrointestinal: Negative.     Height 5\' 4"  (1.626 m), weight 189 lb (85.7 kg). Physical Exam  Vitals reviewed. Constitutional: She is oriented to person, place, and time. She appears well-developed and well-nourished. No distress.  HENT:  Head: Normocephalic and atraumatic.  Eyes: Conjunctivae are normal.  Neck: Neck supple. No thyromegaly present.  Cardiovascular: Normal rate and regular rhythm.  Respiratory: Effort normal and breath sounds normal.  GI: Soft. There is no tenderness. There is no rebound and no guarding.  Genitourinary:  Genitourinary Comments: Pelvic to be repeated in OR  Musculoskeletal: Normal range of motion. She exhibits no tenderness.  Neurological: She is alert and oriented to person, place, and  time.  Skin: Skin is warm and dry.  Psychiatric: She has a normal mood and affect.   Vitals:   06/24/17 1617 07/02/17 0829  BP:  (!) 139/106  Pulse:  (!) 102  Resp:  16  Temp:  97.9 F (36.6 C)  TempSrc:  Oral  SpO2:  100%  Weight: 189 lb (85.7 kg)   Height: 5\' 4"  (1.626 m)    CBC    Component Value Date/Time   WBC 10.3 07/02/2017 0815   RBC 4.45 07/02/2017 0815   HGB 13.3 07/02/2017 0815   HCT 39.1 07/02/2017 0815   PLT 417 (H) 07/02/2017 0815   MCV 87.9 07/02/2017 0815   MCH 29.9 07/02/2017 0815   MCHC 34.0 07/02/2017 0815   RDW 13.7 07/02/2017 0815   LYMPHSABS 4,512 (H) 05/09/2015 0942   MONOABS 864 05/09/2015 0942   EOSABS 96 05/09/2015 0942   BASOSABS 0 05/09/2015 0942    Assessment/Plan: 37 yo femalei with menometrorrhagia  IUD unable to be removed in the ofice and possible endometrial polyp.    1.  Exam under anesthesia 2.  Dilation of cervix 3.  IUD removal 4.  Possible hysteroscopy and polypectomy 5.  Pt nervous about surgery, anesthesia will manage BP and check before discharge.   Silas Sacramento 07/02/2017, 7:05 AM

## 2017-07-02 NOTE — Brief Op Note (Signed)
07/02/2017  10:14 AM  PATIENT:  [name]  37 y.o. female  PRE-OPERATIVE DIAGNOSIS:  IUD Removal AUB  POST-OPERATIVE DIAGNOSIS:  IUD Removal  PROCEDURE:  Procedure(s): DILATATION AND CURETTAGE /HYSTEROSCOPY (N/A) INTRAUTERINE DEVICE (IUD) REMOVAL (N/A) OPERATIVE HYSTEROSCOPY (N/A)  SURGEON:  Surgeon(s) and Role:    * Guss Bunde, MD - Primary  ANESTHESIA:   general  EBL:  5 mL   BLOOD ADMINISTERED:none  DRAINS: none   LOCAL MEDICATIONS USED:  NONE  SPECIMEN:  No Specimen  DISPOSITION OF SPECIMEN:  N/A  COUNTS:  YES  TOURNIQUET:  * No tourniquets in log *  DICTATION: .Dragon Dictation  PLAN OF CARE: Discharge to home after PACU  PATIENT DISPOSITION:  PACU - hemodynamically stable.   Delay start of Pharmacological VTE agent (>24hrs) due to surgical blood loss or risk of bleeding: n/a

## 2017-07-02 NOTE — Anesthesia Postprocedure Evaluation (Signed)
Anesthesia Post Note  Patient: [name]  Procedure(s) Performed: DILATATION AND CURETTAGE /HYSTEROSCOPY (N/A Vagina ) INTRAUTERINE DEVICE (IUD) REMOVAL (N/A Vagina ) OPERATIVE HYSTEROSCOPY (N/A Vagina )     Patient location during evaluation: PACU Anesthesia Type: General Level of consciousness: awake and alert Pain management: pain level controlled Vital Signs Assessment: post-procedure vital signs reviewed and stable Respiratory status: spontaneous breathing, nonlabored ventilation, respiratory function stable and patient connected to nasal cannula oxygen Cardiovascular status: blood pressure returned to baseline and stable Postop Assessment: no apparent nausea or vomiting Anesthetic complications: no    Last Vitals:  Vitals:   07/02/17 1040 07/02/17 1115  BP: (!) 140/92 140/90  Pulse: 90 90  Resp: 16 16  Temp:  36.9 C  SpO2: 100% 100%    Last Pain:  Vitals:   07/02/17 0829  TempSrc: Oral  PainSc: 4    Pain Goal: Patients Stated Pain Goal: 4 (07/02/17 0829)               Karyl Kinnier Ellender

## 2017-07-02 NOTE — Anesthesia Preprocedure Evaluation (Addendum)
Anesthesia Evaluation  Patient identified by MRN, date of birth, ID band Patient awake    Reviewed: Allergy & Precautions, NPO status , Patient's Chart, lab work & pertinent test results  Airway Mallampati: II  TM Distance: >3 FB Neck ROM: Full    Dental  (+) Chipped,  Bilateral ear piercing :   Pulmonary neg pulmonary ROS,    Pulmonary exam normal breath sounds clear to auscultation       Cardiovascular negative cardio ROS Normal cardiovascular exam Rhythm:Regular Rate:Normal     Neuro/Psych  Headaches, negative psych ROS   GI/Hepatic negative GI ROS, Neg liver ROS,   Endo/Other  negative endocrine ROS  Renal/GU negative Renal ROS     Musculoskeletal negative musculoskeletal ROS (+)   Abdominal (+) + obese,   Peds  Hematology negative hematology ROS (+)   Anesthesia Other Findings IUD Removal AUB  Reproductive/Obstetrics                            Anesthesia Physical Anesthesia Plan  ASA: II  Anesthesia Plan: General   Post-op Pain Management:    Induction: Intravenous  PONV Risk Score and Plan: 4 or greater and Scopolamine patch - Pre-op, Midazolam, Dexamethasone, Ondansetron and Treatment may vary due to age or medical condition  Airway Management Planned: LMA  Additional Equipment:   Intra-op Plan:   Post-operative Plan: Extubation in OR  Informed Consent: I have reviewed the patients History and Physical, chart, labs and discussed the procedure including the risks, benefits and alternatives for the proposed anesthesia with the patient or authorized representative who has indicated his/her understanding and acceptance.   Dental advisory given  Plan Discussed with: CRNA  Anesthesia Plan Comments:         Anesthesia Quick Evaluation

## 2017-07-02 NOTE — Transfer of Care (Signed)
Immediate Anesthesia Transfer of Care Note  Patient: [name]  Procedure(s) Performed: DILATATION AND CURETTAGE /HYSTEROSCOPY (N/A Vagina ) INTRAUTERINE DEVICE (IUD) REMOVAL (N/A Vagina ) OPERATIVE HYSTEROSCOPY (N/A Vagina )  Patient Location: PACU  Anesthesia Type:General  Level of Consciousness: awake, alert , oriented and patient cooperative  Airway & Oxygen Therapy: Patient Spontanous Breathing and Patient connected to nasal cannula oxygen  Post-op Assessment: Report given to RN and Post -op Vital signs reviewed and stable  Post vital signs: Reviewed and stable  Last Vitals:  Vitals Value Taken Time  BP 147/104 07/02/2017 10:06 AM  Temp    Pulse 102 07/02/2017 10:10 AM  Resp 20 07/02/2017 10:10 AM  SpO2 99 % 07/02/2017 10:10 AM  Vitals shown include unvalidated device data.  Last Pain:  Vitals:   07/02/17 0829  TempSrc: Oral  PainSc: 4       Patients Stated Pain Goal: 4 (09/27/28 0762)  Complications: No apparent anesthesia complications

## 2017-07-03 ENCOUNTER — Encounter (HOSPITAL_COMMUNITY): Payer: Self-pay | Admitting: Obstetrics & Gynecology

## 2017-07-07 ENCOUNTER — Telehealth: Payer: Self-pay | Admitting: *Deleted

## 2017-07-07 NOTE — Telephone Encounter (Signed)
Patient called and stated that she has had increased bleeding since IUD removal on 07/02/17. Changing Overnight Pad every other hour. Told patient that no clinical staff is in the office this afternoon or Thursday due to holiday but will be in on Friday and that I will route message. If bleeding increases she will go to [facility].

## 2017-07-12 ENCOUNTER — Telehealth: Payer: Self-pay | Admitting: *Deleted

## 2017-07-12 NOTE — Telephone Encounter (Signed)
LM on voicemail to call office to report her bleeding status since having her IUD removed last week.

## 2017-07-12 NOTE — Telephone Encounter (Signed)
Pt returned my call.  She states that the bleeding is better than on 07/07/17.  She does state that she is still having some brownish bleeding, which I told her is normal.  She is to decide which route she wants to go as far as hysterectomy(due to fibroids) or tolerate the bleeding being minimized with Megace.  She states that she does have the RX for Megace and will take when the bleeding is heavy.  She will get back with Dr Gala Romney regarding her surgery decision.

## 2017-07-12 NOTE — Telephone Encounter (Signed)
-----   Message from Guss Bunde, MD sent at 07/12/2017 12:45 PM EDT ----- Can you call and see if she is still bleeding.  (see note in Epic)

## 2017-09-01 ENCOUNTER — Encounter: Payer: Self-pay | Admitting: Family Medicine

## 2017-09-01 ENCOUNTER — Ambulatory Visit (INDEPENDENT_AMBULATORY_CARE_PROVIDER_SITE_OTHER): Payer: No Typology Code available for payment source | Admitting: Family Medicine

## 2017-09-01 ENCOUNTER — Ambulatory Visit: Payer: No Typology Code available for payment source | Admitting: Physician Assistant

## 2017-09-01 VITALS — BP 131/86 | HR 106 | Temp 97.7°F | Wt 190.7 lb

## 2017-09-01 DIAGNOSIS — R7301 Impaired fasting glucose: Secondary | ICD-10-CM

## 2017-09-01 DIAGNOSIS — N760 Acute vaginitis: Secondary | ICD-10-CM | POA: Diagnosis not present

## 2017-09-01 LAB — WET PREP FOR TRICH, YEAST, CLUE
MICRO NUMBER:: 91029430
Specimen Quality: ADEQUATE

## 2017-09-01 LAB — POCT GLYCOSYLATED HEMOGLOBIN (HGB A1C): HEMOGLOBIN A1C: 5.9 % — AB (ref 4.0–5.6)

## 2017-09-01 MED ORDER — METRONIDAZOLE 500 MG PO TABS
500.0000 mg | ORAL_TABLET | Freq: Two times a day (BID) | ORAL | 0 refills | Status: DC
Start: 2017-09-01 — End: 2017-10-11

## 2017-09-01 NOTE — Progress Notes (Signed)
   Subjective:    Patient ID: [name], female    DOB: 08/01/1980, 37 y.o.   MRN: [id number]  HPI 37 year old female comes in today complaining of vaginal discharge.  She actually just had a D&C in June of this year.  She is concerned that she may have a yeast infection.  She has had some increased white discharge for about 3 to 4 days with some vaginal itching.  She denies any dysuria or urinary symptoms.  No fevers chills or sweats.  She has not tried any over-the-counter treatments.  Impaired fasting glucose-no increased thirst or urination. No symptoms consistent with hypoglycemia.   Review of Systems     Objective:   Physical Exam  Constitutional: She is oriented to person, place, and time. She appears well-developed and well-nourished.  HENT:  Head: Normocephalic and atraumatic.  Eyes: Conjunctivae and EOM are normal.  Cardiovascular: Normal rate.  Pulmonary/Chest: Effort normal.  Neurological: She is alert and oriented to person, place, and time.  Skin: Skin is dry. No pallor.  Psychiatric: She has a normal mood and affect. Her behavior is normal.  Vitals reviewed.         Assessment & Plan:  Vaginitis-likely yeast candidiasis but will do wet prep for confirmation.  Will call with results once available.  Impaired fasting glucose- STable. A1C of 5.9 today. Reminded her to stick with low sugar and low carb diet. REcheck in 18months.    declines flu vaccine if she will get it for you through work.  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

## 2017-09-01 NOTE — Addendum Note (Signed)
Addended by: Beatrice Lecher D on: 09/01/2017 03:06 PM   Modules accepted: Orders

## 2017-09-01 NOTE — Patient Instructions (Signed)
We will call you with swab results later today.

## 2017-10-11 ENCOUNTER — Ambulatory Visit (INDEPENDENT_AMBULATORY_CARE_PROVIDER_SITE_OTHER): Payer: No Typology Code available for payment source | Admitting: Family Medicine

## 2017-10-11 ENCOUNTER — Other Ambulatory Visit: Payer: Self-pay | Admitting: Family Medicine

## 2017-10-11 ENCOUNTER — Encounter: Payer: Self-pay | Admitting: Family Medicine

## 2017-10-11 VITALS — BP 134/73 | HR 98 | Ht 65.0 in | Wt 197.0 lb

## 2017-10-11 DIAGNOSIS — R1030 Lower abdominal pain, unspecified: Secondary | ICD-10-CM

## 2017-10-11 DIAGNOSIS — R1032 Left lower quadrant pain: Secondary | ICD-10-CM

## 2017-10-11 LAB — POCT URINALYSIS DIPSTICK
Bilirubin, UA: NEGATIVE
Glucose, UA: NEGATIVE
LEUKOCYTES UA: NEGATIVE
Nitrite, UA: NEGATIVE
PH UA: 6 (ref 5.0–8.0)
Protein, UA: NEGATIVE
Spec Grav, UA: 1.025 (ref 1.010–1.025)
UROBILINOGEN UA: 0.2 U/dL

## 2017-10-11 NOTE — Progress Notes (Signed)
Subjective:    Patient ID: [name], female    DOB: 1980-11-30, 37 y.o.   MRN: [id number]  HPI 37 yo female c/o fo abdominal pain (LAST PAP 04/2015 NL NO HPV) x 2-3 mos low abdominal pain 5/10 pain lasts for hours, its sharp, and crampy at times. She has to take pepto bismuth tablets to help ease the pain along with a tylenol, she does experience some nausea, denies any vaginal d/c or pain with intercourse.  She says she has vomited a couple of times last time was about a week ago.  She denies any blood in her stool or urine.  No hematuria or frequency.  She denies feeling extra gassy.  She has had some occasional heartburn but no increase in belching.  No fevers, chills, or sweats.  She says her stools are somewhat irregular.  2 weeks ago she was having more loose stools than last week she was going pretty regularly but only passing a very small amount of stool.   She no longer has an IUD in but has been getting Depo-Provera injections.  Review of Systems  BP 134/73   Pulse 98   Ht 5\' 5"  (1.651 m)   Wt 197 lb (89.4 kg)   SpO2 98%   BMI 32.78 kg/m     Allergies  Allergen Reactions  . Latex Rash  . Amoxicillin Other (See Comments)    REACTION: Pt states she always gets yeast infection. Denies history of allergic rash or urticaria or any generalized reaction   . Penicillins     Past Medical History:  Diagnosis Date  . Headache   . Herpes simplex without mention of complication    daily acyclovir  . Vaginal yeast infection    recurrent    Past Surgical History:  Procedure Laterality Date  . BREAST SURGERY Bilateral    benign cysts  . CESAREAN SECTION     x 1  . HYSTEROSCOPY N/A 07/02/2017   Procedure: OPERATIVE HYSTEROSCOPY;  Surgeon: Guss Bunde, MD;  Location: [facility];  Service: Gynecology;  Laterality: N/A;  . HYSTEROSCOPY W/D&C N/A 07/02/2017   Procedure: DILATATION AND CURETTAGE /HYSTEROSCOPY;  Surgeon: Guss Bunde, MD;  Location: [facility];  Service:  Gynecology;  Laterality: N/A;  . IUD REMOVAL N/A 07/02/2017   Procedure: INTRAUTERINE DEVICE (IUD) REMOVAL;  Surgeon: Guss Bunde, MD;  Location: [facility];  Service: Gynecology;  Laterality: N/A;  . WISDOM TOOTH EXTRACTION      Social History   Socioeconomic History  . Marital status: Single    Spouse name: Not on file  . Number of children: Not on file  . Years of education: Not on file  . Highest education level: Not on file  Occupational History    Comment: [employer]  Social Needs  . Financial resource strain: Not on file  . Food insecurity:    Worry: Not on file    Inability: Not on file  . Transportation needs:    Medical: Not on file    Non-medical: Not on file  Tobacco Use  . Smoking status: Never Smoker  . Smokeless tobacco: Never Used  Substance and Sexual Activity  . Alcohol use: No  . Drug use: No  . Sexual activity: Yes    Partners: Male    Birth control/protection: IUD, Injection    Comment: Mirena/Depo Injection  Lifestyle  . Physical activity:    Days per week: Not on file    Minutes per  session: Not on file  . Stress: Not on file  Relationships  . Social connections:    Talks on phone: Not on file    Gets together: Not on file    Attends religious service: Not on file    Active member of club or organization: Not on file    Attends meetings of clubs or organizations: Not on file    Relationship status: Not on file  . Intimate partner violence:    Fear of current or ex partner: Not on file    Emotionally abused: Not on file    Physically abused: Not on file    Forced sexual activity: Not on file  Other Topics Concern  . Not on file  Social History Narrative  . Not on file    Family History  Problem Relation Age of Onset  . Cancer Maternal Aunt        breast  . Cancer Maternal Aunt        breast    Outpatient Encounter Medications as of 10/11/2017  Medication Sig  . acyclovir (ZOVIRAX) 400 MG tablet TAKE 1 TABLET (400 MG TOTAL)  BY MOUTH 3 (THREE) TIMES DAILY. X 5 DAYS. (Patient taking differently: Take 400 mg by mouth daily as needed (OUTBREAKS). )  . aspirin-acetaminophen-caffeine (EXCEDRIN MIGRAINE) 250-250-65 MG tablet Take 2 tablets by mouth every 6 (six) hours as needed for headache.  . ibuprofen (ADVIL,MOTRIN) 600 MG tablet Take 1 tablet every 6 hours for 5 days.  . medroxyPROGESTERone (DEPO-PROVERA) 150 MG/ML injection Inject 1 mL (150 mg total) into the muscle every 3 (three) months.  . [DISCONTINUED] clobetasol cream (TEMOVATE) 0.05 % APPLY TO AFFECTED AREA TWICE A DAY (Patient taking differently: APPLY TO AFFECTED AREA TWICE A DAY AS NEEDED)  . [DISCONTINUED] metroNIDAZOLE (FLAGYL) 500 MG tablet Take 1 tablet (500 mg total) by mouth 2 (two) times daily.   No facility-administered encounter medications on file as of 10/11/2017.        Objective:   Physical Exam  Constitutional: She is oriented to person, place, and time. She appears well-developed and well-nourished.  HENT:  Head: Normocephalic and atraumatic.  Cardiovascular: Normal rate, regular rhythm and normal heart sounds.  Pulmonary/Chest: Effort normal and breath sounds normal.  Abdominal: Soft. Bowel sounds are normal. She exhibits no distension and no mass. There is tenderness. There is no rebound and no guarding. No hernia.  Mildly tender in the LLQ.   Neurological: She is alert and oriented to person, place, and time.  Skin: Skin is warm and dry.  Psychiatric: She has a normal mood and affect. Her behavior is normal.       Assessment & Plan:  Lower abdominal pain, worse in the LLQ -diverticulitis versus ovarian cyst versus colitis.  Check CBC for infection and anemia and for blood loss.  We will also check her liver enzymes.  We will schedule her for CT abdomen pelvis for further work-up. Ok to use pepto and tylenol for pain for control.

## 2017-10-12 LAB — COMPLETE METABOLIC PANEL WITH GFR
AG Ratio: 1.4 (calc) (ref 1.0–2.5)
ALT: 14 U/L (ref 6–29)
AST: 15 U/L (ref 10–30)
Albumin: 4.2 g/dL (ref 3.6–5.1)
Alkaline phosphatase (APISO): 86 U/L (ref 33–115)
BILIRUBIN TOTAL: 0.3 mg/dL (ref 0.2–1.2)
BUN: 13 mg/dL (ref 7–25)
CALCIUM: 9.5 mg/dL (ref 8.6–10.2)
CO2: 25 mmol/L (ref 20–32)
Chloride: 105 mmol/L (ref 98–110)
Creat: 0.77 mg/dL (ref 0.50–1.10)
GFR, EST AFRICAN AMERICAN: 113 mL/min/{1.73_m2} (ref >=60)
GFR, Est Non African American: 97 mL/min/{1.73_m2} (ref >=60)
GLOBULIN: 3.1 g/dL (ref 1.9–3.7)
Glucose, Bld: 105 mg/dL — ABNORMAL HIGH (ref 65–99)
Potassium: 3.8 mmol/L (ref 3.5–5.3)
SODIUM: 137 mmol/L (ref 135–146)
TOTAL PROTEIN: 7.3 g/dL (ref 6.1–8.1)

## 2017-10-12 LAB — CBC WITH DIFFERENTIAL/PLATELET
Basophils Absolute: 38 cells/uL (ref 0–200)
Basophils Relative: 0.4 %
EOS ABS: 228 {cells}/uL (ref 15–500)
EOS PCT: 2.4 %
HCT: 41 % (ref 35.0–45.0)
HEMOGLOBIN: 13.5 g/dL (ref 11.7–15.5)
LYMPHS ABS: 2993 {cells}/uL (ref 850–3900)
MCH: 29 pg (ref 27.0–33.0)
MCHC: 32.9 g/dL (ref 32.0–36.0)
MCV: 88.2 fL (ref 80.0–100.0)
MONOS PCT: 10.1 %
MPV: 9.7 fL (ref 7.5–12.5)
Neutro Abs: 5282 cells/uL (ref 1500–7800)
Neutrophils Relative %: 55.6 %
Platelets: 419 10*3/uL — ABNORMAL HIGH (ref 140–400)
RBC: 4.65 10*6/uL (ref 3.80–5.10)
RDW: 13.6 % (ref 11.0–15.0)
Total Lymphocyte: 31.5 %
WBC mixed population: 960 cells/uL — ABNORMAL HIGH (ref 200–950)
WBC: 9.5 10*3/uL (ref 3.8–10.8)

## 2017-10-13 LAB — URINALYSIS, ROUTINE W REFLEX MICROSCOPIC
BILIRUBIN URINE: NEGATIVE
Bacteria, UA: NONE SEEN /HPF
GLUCOSE, UA: NEGATIVE
HYALINE CAST: NONE SEEN /LPF
Ketones, ur: NEGATIVE
Leukocytes, UA: NEGATIVE
NITRITE: NEGATIVE
PH: 6 (ref 5.0–8.0)
Specific Gravity, Urine: 1.025 (ref 1.001–1.03)
Squamous Epithelial / LPF: NONE SEEN /HPF (ref <=5)
WBC UA: NONE SEEN /HPF (ref 0–5)

## 2017-10-18 ENCOUNTER — Ambulatory Visit (INDEPENDENT_AMBULATORY_CARE_PROVIDER_SITE_OTHER): Payer: No Typology Code available for payment source | Admitting: Family Medicine

## 2017-10-18 ENCOUNTER — Telehealth: Payer: Self-pay | Admitting: Family Medicine

## 2017-10-18 ENCOUNTER — Other Ambulatory Visit: Payer: Self-pay | Admitting: *Deleted

## 2017-10-18 ENCOUNTER — Encounter: Payer: Self-pay | Admitting: Family Medicine

## 2017-10-18 ENCOUNTER — Other Ambulatory Visit: Payer: Self-pay | Admitting: Family Medicine

## 2017-10-18 VITALS — BP 146/95 | HR 98 | Ht 65.0 in | Wt 196.0 lb

## 2017-10-18 DIAGNOSIS — R3129 Other microscopic hematuria: Secondary | ICD-10-CM

## 2017-10-18 DIAGNOSIS — I10 Essential (primary) hypertension: Secondary | ICD-10-CM | POA: Diagnosis not present

## 2017-10-18 DIAGNOSIS — L301 Dyshidrosis [pompholyx]: Secondary | ICD-10-CM

## 2017-10-18 DIAGNOSIS — Z0000 Encounter for general adult medical examination without abnormal findings: Secondary | ICD-10-CM

## 2017-10-18 MED ORDER — LOSARTAN POTASSIUM 25 MG PO TABS: 25.0000 mg | ORAL_TABLET | Freq: Every day | ORAL | 1 refills | Status: DC

## 2017-10-18 MED ORDER — CLOBETASOL PROPIONATE 0.05 % EX OINT: 1.0000 "application " | TOPICAL_OINTMENT | Freq: Every day | CUTANEOUS | 1 refills | Status: DC | PRN

## 2017-10-18 NOTE — Telephone Encounter (Signed)
You have not filled this for her in over a year. Ok to fill?

## 2017-10-18 NOTE — Progress Notes (Signed)
Subjective:     [name] is a 37 y.o. female and is here for a comprehensive physical exam. The patient reports problems - rash on thenar eminence.  Now has a rash on the side of her left thumb.  Just along the palmar edge.  It feels bumpy and dry she is been using some over-the-counter hydrocortisone.  She says it helps a little but it does not get it completely go away.  She has a similar rash on the bottoms of her feet which is been coming and going for quite some time.  We have given her prescription cream for that in the past but she says it expired.  She is not currently exercising.  Had flu shot at work.   Social History   Socioeconomic History  . Marital status: Single    Spouse name: Not on file  . Number of children: Not on file  . Years of education: Not on file  . Highest education level: Not on file  Occupational History    Comment: [employer]  Social Needs  . Financial resource strain: Not on file  . Food insecurity:    Worry: Not on file    Inability: Not on file  . Transportation needs:    Medical: Not on file    Non-medical: Not on file  Tobacco Use  . Smoking status: Never Smoker  . Smokeless tobacco: Never Used  Substance and Sexual Activity  . Alcohol use: No  . Drug use: No  . Sexual activity: Yes    Partners: Male    Birth control/protection: IUD, Injection    Comment: Mirena/Depo Injection  Lifestyle  . Physical activity:    Days per week: Not on file    Minutes per session: Not on file  . Stress: Not on file  Relationships  . Social connections:    Talks on phone: Not on file    Gets together: Not on file    Attends religious service: Not on file    Active member of club or organization: Not on file    Attends meetings of clubs or organizations: Not on file    Relationship status: Not on file  . Intimate partner violence:    Fear of current or ex partner: Not on file    Emotionally abused: Not on file    Physically abused: Not on  file    Forced sexual activity: Not on file  Other Topics Concern  . Not on file  Social History Narrative  . Not on file   Health Maintenance  Topic Date Due  . PAP SMEAR  05/02/2020  . TETANUS/TDAP  12/17/2020  . INFLUENZA VACCINE  Completed  . HIV Screening  Completed    The following portions of the patient's history were reviewed and updated as appropriate: allergies, current medications, past family history, past medical history, past social history, past surgical history and problem list.  Review of Systems A comprehensive review of systems was negative.   Objective:    BP (!) 145/108   Pulse 98   Ht 5\' 5"  (1.651 m)   Wt 196 lb (88.9 kg)   SpO2 100%   BMI 32.62 kg/m  General appearance: alert, cooperative and appears stated age Head: Normocephalic, without obvious abnormality, atraumatic Eyes: conj clear, EOMI, PEERLA Ears: normal TM's and external ear canals both ears Nose: Nares normal. Septum midline. Mucosa normal. No drainage or sinus tenderness. Throat: lips, mucosa, and tongue normal; teeth and gums normal  Neck: no adenopathy, no carotid bruit, no JVD, supple, symmetrical, trachea midline and thyroid not enlarged, symmetric, no tenderness/mass/nodules Back: symmetric, no curvature. ROM normal. No CVA tenderness. Lungs: clear to auscultation bilaterally Breasts: normal appearance, no masses or tenderness Heart: regular rate and rhythm, S1, S2 normal, no murmur, click, rub or gallop Abdomen: soft, non-tender; bowel sounds normal; no masses,  no organomegaly Extremities: extremities normal, atraumatic, no cyanosis or edema Pulses: 2+ and symmetric Skin: she had s thick maculopapular rash with scale on the side of her lef thumb and on the soles of her feet Lymph nodes: Cervical, supraclavicular, and axillary nodes normal. Neurologic: Alert and oriented X 3, normal strength and tone. Normal symmetric reflexes. Normal coordination and gait    Assessment:     Healthy female exam.      Plan:     See After Visit Summary for Counseling Recommendations   Keep up a regular exercise program and make sure you are eating a healthy diet Try to eat 4 servings of dairy a day, or if you are lactose intolerant take a calcium with vitamin D daily.  Your vaccines are up to date.   REviewed recent labs with her.   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

## 2017-10-18 NOTE — Telephone Encounter (Signed)
Called pt and advised her of recommendations, and transferred her to scheduling to make f/u appt.Elouise Munroe, [place]

## 2017-10-18 NOTE — Patient Instructions (Signed)
Preventive Care 18-39 Years, Female Preventive care refers to lifestyle choices and visits with your health care provider that can promote health and wellness. What does preventive care include?  A yearly physical exam. This is also called an annual well check.  Dental exams once or twice a year.  Routine eye exams. Ask your health care provider how often you should have your eyes checked.  Personal lifestyle choices, including: ? Daily care of your teeth and gums. ? Regular physical activity. ? Eating a healthy diet. ? Avoiding tobacco and drug use. ? Limiting alcohol use. ? Practicing safe sex. ? Taking vitamin and mineral supplements as recommended by your health care provider. What happens during an annual well check? The services and screenings done by your health care provider during your annual well check will depend on your age, overall health, lifestyle risk factors, and family history of disease. Counseling Your health care provider may ask you questions about your:  Alcohol use.  Tobacco use.  Drug use.  Emotional well-being.  Home and relationship well-being.  Sexual activity.  Eating habits.  Work and work Statistician.  Method of birth control.  Menstrual cycle.  Pregnancy history.  Screening You may have the following tests or measurements:  Height, weight, and BMI.  Diabetes screening. This is done by checking your blood sugar (glucose) after you have not eaten for a while (fasting).  Blood pressure.  Lipid and cholesterol levels. These may be checked every 5 years starting at age 38.  Skin check.  Hepatitis C blood test.  Hepatitis B blood test.  Sexually transmitted disease (STD) testing.  BRCA-related cancer screening. This may be done if you have a family history of breast, ovarian, tubal, or peritoneal cancers.  Pelvic exam and Pap test. This may be done every 3 years starting at age 38. Starting at age 30, this may be done  every 5 years if you have a Pap test in combination with an HPV test.  Discuss your test results, treatment options, and if necessary, the need for more tests with your health care provider. Vaccines Your health care provider may recommend certain vaccines, such as:  Influenza vaccine. This is recommended every year.  Tetanus, diphtheria, and acellular pertussis (Tdap, Td) vaccine. You may need a Td booster every 10 years.  Varicella vaccine. You may need this if you have not been vaccinated.  HPV vaccine. If you are 39 or younger, you may need three doses over 6 months.  Measles, mumps, and rubella (MMR) vaccine. You may need at least one dose of MMR. You may also need a second dose.  Pneumococcal 13-valent conjugate (PCV13) vaccine. You may need this if you have certain conditions and were not previously vaccinated.  Pneumococcal polysaccharide (PPSV23) vaccine. You may need one or two doses if you smoke cigarettes or if you have certain conditions.  Meningococcal vaccine. One dose is recommended if you are age 68-21 years and a first-year college student living in a residence hall, or if you have one of several medical conditions. You may also need additional booster doses.  Hepatitis A vaccine. You may need this if you have certain conditions or if you travel or work in places where you may be exposed to hepatitis A.  Hepatitis B vaccine. You may need this if you have certain conditions or if you travel or work in places where you may be exposed to hepatitis B.  Haemophilus influenzae type b (Hib) vaccine. You may need this  if you have certain risk factors.  Talk to your health care provider about which screenings and vaccines you need and how often you need them. This information is not intended to replace advice given to you by your health care provider. Make sure you discuss any questions you have with your health care provider. Document Released: 02/17/2001 Document Revised:  09/11/2015 Document Reviewed: 10/23/2014 Elsevier Interactive Patient Education  2018 Elsevier Inc.  

## 2017-10-18 NOTE — Telephone Encounter (Signed)
Called in new script for losartan to control  BP. Please schedule f/u wht nurse in 2 weeks for BP check.

## 2017-10-18 NOTE — Addendum Note (Signed)
Addended by: Beatrice Lecher D on: 10/18/2017 10:53 AM   Modules accepted: Orders

## 2017-11-08 ENCOUNTER — Ambulatory Visit: Payer: No Typology Code available for payment source | Admitting: Family Medicine

## 2017-11-09 ENCOUNTER — Other Ambulatory Visit: Payer: Self-pay | Admitting: Family Medicine

## 2017-12-22 ENCOUNTER — Other Ambulatory Visit: Payer: Self-pay | Admitting: Family Medicine

## 2018-01-17 ENCOUNTER — Ambulatory Visit: Payer: No Typology Code available for payment source | Admitting: Family Medicine

## 2018-01-17 NOTE — Progress Notes (Deleted)
Acute Office Visit  Subjective:    Patient ID: [name], female    DOB: [birth year], 38 y.o.   MRN: [id number]  No chief complaint on file.   HPI Patient is in today for vaginal discharge.   Past Medical History:  Diagnosis Date  . Headache   . Herpes simplex without mention of complication    daily acyclovir  . Vaginal yeast infection    recurrent    Past Surgical History:  Procedure Laterality Date  . BREAST SURGERY Bilateral    benign cysts  . CESAREAN SECTION     x 1  . HYSTEROSCOPY N/A 07/02/2017   Procedure: OPERATIVE HYSTEROSCOPY;  Surgeon: Guss Bunde, MD;  Location: [facility];  Service: Gynecology;  Laterality: N/A;  . HYSTEROSCOPY W/D&C N/A 07/02/2017   Procedure: DILATATION AND CURETTAGE /HYSTEROSCOPY;  Surgeon: Guss Bunde, MD;  Location: [facility];  Service: Gynecology;  Laterality: N/A;  . IUD REMOVAL N/A 07/02/2017   Procedure: INTRAUTERINE DEVICE (IUD) REMOVAL;  Surgeon: Guss Bunde, MD;  Location: [facility];  Service: Gynecology;  Laterality: N/A;  . WISDOM TOOTH EXTRACTION      Family History  Problem Relation Age of Onset  . Cancer Maternal Aunt        breast  . Cancer Maternal Aunt        breast    Social History   Socioeconomic History  . Marital status: Single    Spouse name: Not on file  . Number of children: Not on file  . Years of education: Not on file  . Highest education level: Not on file  Occupational History    Comment: [employer]  Social Needs  . Financial resource strain: Not on file  . Food insecurity:    Worry: Not on file    Inability: Not on file  . Transportation needs:    Medical: Not on file    Non-medical: Not on file  Tobacco Use  . Smoking status: Never Smoker  . Smokeless tobacco: Never Used  Substance and Sexual Activity  . Alcohol use: No  . Drug use: No  . Sexual activity: Yes    Partners: Male    Birth control/protection: I.U.D., Injection    Comment: Mirena/Depo Injection  Lifestyle   . Physical activity:    Days per week: Not on file    Minutes per session: Not on file  . Stress: Not on file  Relationships  . Social connections:    Talks on phone: Not on file    Gets together: Not on file    Attends religious service: Not on file    Active member of club or organization: Not on file    Attends meetings of clubs or organizations: Not on file    Relationship status: Not on file  . Intimate partner violence:    Fear of current or ex partner: Not on file    Emotionally abused: Not on file    Physically abused: Not on file    Forced sexual activity: Not on file  Other Topics Concern  . Not on file  Social History Narrative  . Not on file    Outpatient Medications Prior to Visit  Medication Sig Dispense Refill  . acyclovir (ZOVIRAX) 400 MG tablet TAKE 1 TABLET (400 MG TOTAL) BY MOUTH 3 (THREE) TIMES DAILY. X 5 DAYS. (Patient taking differently: Take 400 mg by mouth daily as needed (OUTBREAKS). ) 45 tablet 0  . aspirin-acetaminophen-caffeine (EXCEDRIN MIGRAINE)  250-250-65 MG tablet Take 2 tablets by mouth every 6 (six) hours as needed for headache.    . clobetasol ointment (TEMOVATE) 6.22 % Apply 1 application topically daily as needed. 60 g 1  . ibuprofen (ADVIL,MOTRIN) 600 MG tablet Take 1 tablet every 6 hours for 5 days. 30 tablet 1  . losartan (COZAAR) 25 MG tablet TAKE 1 TABLET BY MOUTH EVERY DAY 30 tablet 1  . medroxyPROGESTERone (DEPO-PROVERA) 150 MG/ML injection Inject 1 mL (150 mg total) into the muscle every 3 (three) months. 1 mL 3  . metroNIDAZOLE (METROGEL) 0.75 % vaginal gel PLACE 1 APPLICATORFUL VAGINALLY 2 (TWO) TIMES A WEEK. 70 g 1   No facility-administered medications prior to visit.     Allergies  Allergen Reactions  . Latex Rash  . Amoxicillin Other (See Comments)    REACTION: Pt states she always gets yeast infection. Denies history of allergic rash or urticaria or any generalized reaction   . Penicillins     ROS     Objective:     Physical Exam  There were no vitals taken for this visit. Wt Readings from Last 3 Encounters:  10/18/17 196 lb (88.9 kg)  10/11/17 197 lb (89.4 kg)  09/01/17 190 lb 11.2 oz (86.5 kg)    There are no preventive care reminders to display for this patient.  There are no preventive care reminders to display for this patient.   Lab Results  Component Value Date   TSH 0.81 08/03/2016   Lab Results  Component Value Date   WBC 9.5 10/11/2017   HGB 13.5 10/11/2017   HCT 41.0 10/11/2017   MCV 88.2 10/11/2017   PLT 419 (H) 10/11/2017   Lab Results  Component Value Date   NA 137 10/11/2017   K 3.8 10/11/2017   CO2 25 10/11/2017   GLUCOSE 105 (H) 10/11/2017   BUN 13 10/11/2017   CREATININE 0.77 10/11/2017   BILITOT 0.3 10/11/2017   ALKPHOS 58 05/09/2015   AST 15 10/11/2017   ALT 14 10/11/2017   PROT 7.3 10/11/2017   ALBUMIN 4.3 05/09/2015   CALCIUM 9.5 10/11/2017   Lab Results  Component Value Date   CHOL 142 05/09/2015   Lab Results  Component Value Date   HDL 75 05/09/2015   Lab Results  Component Value Date   LDLCALC 51 05/09/2015   Lab Results  Component Value Date   TRIG 81 05/09/2015   Lab Results  Component Value Date   CHOLHDL 1.9 05/09/2015   Lab Results  Component Value Date   HGBA1C 5.9 (A) 09/01/2017       Assessment & Plan:   Problem List Items Addressed This Visit    None       No orders of the defined types were placed in this encounter.    Beatrice Lecher, MD

## 2018-02-09 ENCOUNTER — Ambulatory Visit (INDEPENDENT_AMBULATORY_CARE_PROVIDER_SITE_OTHER): Payer: No Typology Code available for payment source | Admitting: Family Medicine

## 2018-02-09 ENCOUNTER — Telehealth: Payer: Self-pay

## 2018-02-09 ENCOUNTER — Encounter: Payer: Self-pay | Admitting: Family Medicine

## 2018-02-09 DIAGNOSIS — I10 Essential (primary) hypertension: Secondary | ICD-10-CM | POA: Diagnosis not present

## 2018-02-09 MED ORDER — TRIAMTERENE-HCTZ 37.5-25 MG PO TABS: 1.0000 | ORAL_TABLET | Freq: Every day | ORAL | 1 refills | Status: DC

## 2018-02-09 NOTE — Progress Notes (Signed)
[name] is a 38 y.o. female who presents to [facility] today for hypertension.  Mieka has a history of hypertension.  She was started on losartan November 2019.  She notes this medication made her lightheaded and dizzy.  She discontinued it on her own.  She notes that she did not check her blood pressure to confirm that she was having low blood pressure episodes.  She was seen today by her gastroenterologist and during her check and her blood pressure was significantly elevated.  She was advised to recheck today with her primary care provider office for reassessment.  She denies chest pain palpitations shortness of breath lightheadedness or dizziness.   ROS as above:  Exam:  BP (!) 158/108   Pulse 93   Ht 5\' 4"  (1.626 m)   Wt 203 lb (92.1 kg)   BMI 34.84 kg/m  Wt Readings from Last 5 Encounters:  02/09/18 203 lb (92.1 kg)  10/18/17 196 lb (88.9 kg)  10/11/17 197 lb (89.4 kg)  09/01/17 190 lb 11.2 oz (86.5 kg)  06/24/17 189 lb (85.7 kg)    Gen: Well NAD HEENT: EOMI,  MMM Lungs: Normal work of breathing. CTABL Heart: RRR no MRG Abd: NABS, Soft. Nondistended, Nontender Exts: Brisk capillary refill, warm and well perfused.    Lab and Radiology Results   Chemistry      Component Value Date/Time   NA 137 10/11/2017 1037   K 3.8 10/11/2017 1037   CL 105 10/11/2017 1037   CO2 25 10/11/2017 1037   BUN 13 10/11/2017 1037   CREATININE 0.77 10/11/2017 1037      Component Value Date/Time   CALCIUM 9.5 10/11/2017 1037   ALKPHOS 58 05/09/2015 0942   AST 15 10/11/2017 1037   ALT 14 10/11/2017 1037   BILITOT 0.3 10/11/2017 1037         Assessment and Plan: 38 y.o. female with hypertension: Blood pressure significantly elevated today. Patient had obnoxious side effects to the losartan.  Will avoid that class for now.  We will switch to  hydrochlorothiazide class.  However on recent lab assessment in October 2019 potassium was a bit low.  Will use Maxide as the potassium sparing diuretic component will reduce hypokalemia.  Recheck with PCP in about a month.  Recommend home blood pressure log as well. PDMP not reviewed this encounter. No orders of the defined types were placed in this encounter.  No orders of the defined types were placed in this encounter.    Historical information moved to improve visibility of documentation.  Past Medical History:  Diagnosis Date  . Headache   . Herpes simplex without mention of complication    daily acyclovir  . Vaginal yeast infection    recurrent   Past Surgical History:  Procedure Laterality Date  . BREAST SURGERY Bilateral    benign cysts  . CESAREAN SECTION     x 1  . HYSTEROSCOPY N/A 07/02/2017   Procedure: OPERATIVE HYSTEROSCOPY;  Surgeon: Guss Bunde, MD;  Location: [facility];  Service: Gynecology;  Laterality: N/A;  . HYSTEROSCOPY W/D&C N/A 07/02/2017   Procedure: DILATATION AND CURETTAGE /HYSTEROSCOPY;  Surgeon: Guss Bunde, MD;  Location: [facility];  Service: Gynecology;  Laterality: N/A;  . IUD REMOVAL N/A 07/02/2017   Procedure: INTRAUTERINE DEVICE (IUD) REMOVAL;  Surgeon: Guss Bunde, MD;  Location: [facility];  Service: Gynecology;  Laterality: N/A;  . WISDOM TOOTH  EXTRACTION     Social History   Tobacco Use  . Smoking status: Never Smoker  . Smokeless tobacco: Never Used  Substance Use Topics  . Alcohol use: No   family history includes Cancer in her maternal aunt and maternal aunt.  Medications: Current Outpatient Medications  Medication Sig Dispense Refill  . acyclovir (ZOVIRAX) 400 MG tablet TAKE 1 TABLET (400 MG TOTAL) BY MOUTH 3 (THREE) TIMES DAILY. X 5 DAYS. (Patient taking differently: Take 400 mg by mouth daily as needed (OUTBREAKS). ) 45 tablet 0  . aspirin-acetaminophen-caffeine (EXCEDRIN MIGRAINE) 250-250-65 MG tablet Take 2 tablets by  mouth every 6 (six) hours as needed for headache.    . clobetasol ointment (TEMOVATE) 1.30 % Apply 1 application topically daily as needed. 60 g 1  . ibuprofen (ADVIL,MOTRIN) 600 MG tablet Take 1 tablet every 6 hours for 5 days. 30 tablet 1  . medroxyPROGESTERone (DEPO-PROVERA) 150 MG/ML injection Inject 1 mL (150 mg total) into the muscle every 3 (three) months. 1 mL 3  . metroNIDAZOLE (METROGEL) 0.75 % vaginal gel PLACE 1 APPLICATORFUL VAGINALLY 2 (TWO) TIMES A WEEK. 70 g 1  . losartan (COZAAR) 25 MG tablet TAKE 1 TABLET BY MOUTH EVERY DAY (Patient not taking: Reported on 02/09/2018) 30 tablet 1   No current facility-administered medications for this visit.    Allergies  Allergen Reactions  . Latex Rash  . Amoxicillin Other (See Comments)    REACTION: Pt states she always gets yeast infection. Denies history of allergic rash or urticaria or any generalized reaction   . Penicillins      Discussed warning signs or symptoms. Please see discharge instructions. Patient expresses understanding.

## 2018-02-09 NOTE — Patient Instructions (Signed)
Thank you for coming in today. Start Maxzide daily for blood pressure.  If you feel lightheaded let us know.  Consider taking 1/2 pill Keep track of blood pressure.   Recheck with Dr Madilyn Fireman in about 1 month.   Return sooner if needed.    Hydrochlorothiazide, HCTZ; Triamterene tablets or capsules What is this medicine? HYDROCHLOROTHIAZIDE; TRIAMTERENE (hye droe klor oh THYE a zide; trye AM ter een) is a diuretic. It helps you make more urine and lose the extra water from your body. This medicine is used to treat high blood pressure and edema or swelling from excess water. This medicine may be used for other purposes; ask your health care provider or pharmacist if you have questions. COMMON BRAND NAME(S): Dyazide, Maxzide What should I tell my health care provider before I take this medicine? They need to know if you have any of these conditions: -diabetes -immune system problems, like lupus -kidney disease or stones -liver disease -small amount of urine or difficulty passing urine -an unusual or allergic reaction to triamterene, hydrochlorothiazide, sulfa drugs, other medicines, foods, dyes, or preservatives -pregnant or trying to get pregnant -breast-feeding How should I use this medicine? Take this medicine by mouth with a glass of water. Follow the directions on your prescription label. Take your medicine at regular intervals. Do not take it more often than directed. Do not stop taking except on your doctor's advice. Remember that you will need to pass urine frequently after taking this medicine. Do not take your doses at a time of day that will cause you problems. Do not take at bedtime. Talk to your pediatrician regarding the use of this medicine in children. Special care may be needed. Overdosage: If you think you have taken too much of this medicine contact a poison control center or emergency room at once. NOTE: This medicine is only for you. Do not share this medicine with  others. What if I miss a dose? If you miss a dose, take it as soon as you can. If it is almost time for your next dose, take only that dose. Do not take double or extra doses. What may interact with this medicine? Do not take this medicine with any of the following medications: -cidofovir -dofetilide -eplerenone -potassium supplements -tranylcypromine This medicine may also interact with the following medications: -certain medicines for blood pressure, heart disease like benazepril, lisinopril, losartan, valsartan -lithium -medicines for diabetes -medicines that relax muscles for surgery -NSAIDs, medicines for pain and inflammation, like ibuprofen or naproxen -other diuretics -penicillin G potassium This list may not describe all possible interactions. Give your health care provider a list of all the medicines, herbs, non-prescription drugs, or dietary supplements you use. Also tell them if you smoke, drink alcohol, or use illegal drugs. Some items may interact with your medicine. What should I watch for while using this medicine? Visit your doctor or health care professional for regular check ups. You will need lab work done before you start this medicine and regularly while you are taking it. Check your blood pressure regularly. Ask your health care professional what your blood pressure should be, and when you should contact them. If you are a diabetic, check your blood sugar as directed. Do not stop taking your medicine unless your doctor tells you to. You may need to be on a special diet while taking this medicine. Ask your doctor. Also, ask how many glasses of fluid you need to drink a day. You must not get dehydrated. You  may get drowsy or dizzy. Do not drive, use machinery, or do anything that needs mental alertness until you know how this medicine affects you. Do not stand or sit up quickly, especially if you are an older patient. This reduces the risk of dizzy or fainting spells.  Alcohol may interfere with the effect of this medicine. Avoid or limit alcoholic drinks. This medicine can make you more sensitive to the sun. Keep out of the sun. If you cannot avoid being in the sun, wear protective clothing and use sunscreen. Do not use sun lamps or tanning beds/booths. What side effects may I notice from receiving this medicine? Side effects that you should report to your doctor or health care professional as soon as possible: -allergic reactions such as skin rash or itching, hives, swelling of the lips, mouth, tongue, or throat -changes in vision -eye pain -fast or irregular heartbeat, chest pain -feeling faint or dizzy -gout attack -muscle pain or cramps -numbness or tingling in hands, feet, or lips -pain or difficulty when passing urine -redness, blistering, peeling or loosening of the skin, including inside the mouth -shortness of breath -unusually weak or tired Side effects that usually do not require medical attention (report to your doctor or health care professional if they continue or are bothersome): -change in sex drive or performance -dry mouth -headache -stomach upset This list may not describe all possible side effects. Call your doctor for medical advice about side effects. You may report side effects to FDA at 1-800-FDA-1088. Where should I keep my medicine? Keep out of the reach of children. Store at room temperature between 15 and 30 degrees C (59 and 86 degrees F). Protect from light. Throw away any unused medicine after the expiration date. NOTE: This sheet is a summary. It may not cover all possible information. If you have questions about this medicine, talk to your doctor, pharmacist, or health care provider.  2019 Elsevier/Gold Standard (2016-05-07 10:08:07)

## 2018-02-09 NOTE — Telephone Encounter (Signed)
[name] has elevated blood pressure. She has been scheduled to see Dr Georgina Snell today.

## 2018-02-14 ENCOUNTER — Other Ambulatory Visit: Payer: Self-pay | Admitting: Obstetrics & Gynecology

## 2018-02-24 ENCOUNTER — Telehealth: Payer: Self-pay

## 2018-02-24 DIAGNOSIS — Z789 Other specified health status: Secondary | ICD-10-CM

## 2018-02-24 MED ORDER — MEDROXYPROGESTERONE ACETATE 150 MG/ML IM SUSP: 150.0000 mg | INTRAMUSCULAR | 0 refills | Status: DC

## 2018-02-24 NOTE — Telephone Encounter (Signed)
PT called needing refill of Depo Provera. Pt is aware we will give her one refill but, she will need to schedule an annual appt to get more refills.

## 2018-03-06 ENCOUNTER — Other Ambulatory Visit: Payer: Self-pay | Admitting: Family Medicine

## 2018-03-09 ENCOUNTER — Ambulatory Visit (INDEPENDENT_AMBULATORY_CARE_PROVIDER_SITE_OTHER): Payer: No Typology Code available for payment source | Admitting: Family Medicine

## 2018-03-09 ENCOUNTER — Encounter: Payer: Self-pay | Admitting: Family Medicine

## 2018-03-09 VITALS — BP 131/92 | HR 92 | Ht 64.0 in | Wt 200.0 lb

## 2018-03-09 DIAGNOSIS — I10 Essential (primary) hypertension: Secondary | ICD-10-CM

## 2018-03-09 MED ORDER — AMLODIPINE BESYLATE 5 MG PO TABS: 5.0000 mg | ORAL_TABLET | Freq: Every day | ORAL | 3 refills | Status: DC

## 2018-03-09 MED ORDER — HYDROCHLOROTHIAZIDE 12.5 MG PO CAPS: 12.5000 mg | ORAL_CAPSULE | Freq: Every day | ORAL | 3 refills | Status: DC

## 2018-03-09 NOTE — Patient Instructions (Addendum)
Start the HCTZ 12.5mg  once a day. If you are doing well after 7-10 days then you can start the amlodipine once a day as well.  We will need to check your labs in 2-3 week with a BMP.

## 2018-03-09 NOTE — Progress Notes (Signed)
Established Patient Office Visit  Subjective:  Patient ID: Christy RUDDELL, female    DOB: 07/16/1980  Age: 38 y.o. MRN: [id number]  CC:  Chief Complaint  Patient presents with  . Hypertension    pt reports that she d/c'd medication due to it causing her to become dizzy    HPI [name] presents for HTN.  She was having side effects on losartan so she was switched to [facility] about 4 weeks ago.  She was feeling dizzy on the medication so she actually stopped it couple of weeks ago. She didn't contact our office.    Past Medical History:  Diagnosis Date  . Headache   . Herpes simplex without mention of complication    daily acyclovir  . Vaginal yeast infection    recurrent    Past Surgical History:  Procedure Laterality Date  . BREAST SURGERY Bilateral    benign cysts  . CESAREAN SECTION     x 1  . HYSTEROSCOPY N/A 07/02/2017   Procedure: OPERATIVE HYSTEROSCOPY;  Surgeon: Guss Bunde, MD;  Location: [facility];  Service: Gynecology;  Laterality: N/A;  . HYSTEROSCOPY W/D&C N/A 07/02/2017   Procedure: DILATATION AND CURETTAGE /HYSTEROSCOPY;  Surgeon: Guss Bunde, MD;  Location: [facility];  Service: Gynecology;  Laterality: N/A;  . IUD REMOVAL N/A 07/02/2017   Procedure: INTRAUTERINE DEVICE (IUD) REMOVAL;  Surgeon: Guss Bunde, MD;  Location: [facility];  Service: Gynecology;  Laterality: N/A;  . WISDOM TOOTH EXTRACTION      Family History  Problem Relation Age of Onset  . Cancer Maternal Aunt        breast  . Cancer Maternal Aunt        breast    Social History   Socioeconomic History  . Marital status: Single    Spouse name: Not on file  . Number of children: Not on file  . Years of education: Not on file  . Highest education level: Not on file  Occupational History    Comment: [employer]  Social Needs  . Financial resource strain: Not on file  . Food insecurity:    Worry: Not on file    Inability: Not on file  . Transportation needs:    Medical:  Not on file    Non-medical: Not on file  Tobacco Use  . Smoking status: Never Smoker  . Smokeless tobacco: Never Used  Substance and Sexual Activity  . Alcohol use: No  . Drug use: No  . Sexual activity: Yes    Partners: Male    Birth control/protection: I.U.D., Injection    Comment: Mirena/Depo Injection  Lifestyle  . Physical activity:    Days per week: Not on file    Minutes per session: Not on file  . Stress: Not on file  Relationships  . Social connections:    Talks on phone: Not on file    Gets together: Not on file    Attends religious service: Not on file    Active member of club or organization: Not on file    Attends meetings of clubs or organizations: Not on file    Relationship status: Not on file  . Intimate partner violence:    Fear of current or ex partner: Not on file    Emotionally abused: Not on file    Physically abused: Not on file    Forced sexual activity: Not on file  Other Topics Concern  . Not on file  Social  History Narrative  . Not on file    Outpatient Medications Prior to Visit  Medication Sig Dispense Refill  . acyclovir (ZOVIRAX) 400 MG tablet TAKE 1 TABLET (400 MG TOTAL) BY MOUTH 3 (THREE) TIMES DAILY. X 5 DAYS. (Patient taking differently: Take 400 mg by mouth daily as needed (OUTBREAKS). ) 45 tablet 0  . aspirin-acetaminophen-caffeine (EXCEDRIN MIGRAINE) 250-250-65 MG tablet Take 2 tablets by mouth every 6 (six) hours as needed for headache.    . clobetasol ointment (TEMOVATE) 3.09 % Apply 1 application topically daily as needed. 60 g 1  . ibuprofen (ADVIL,MOTRIN) 600 MG tablet Take 1 tablet every 6 hours for 5 days. 30 tablet 1  . metroNIDAZOLE (METROGEL) 0.75 % vaginal gel PLACE 1 APPLICATORFUL VAGINALLY 2 (TWO) TIMES A WEEK. 70 g 1  . triamterene-hydrochlorothiazide (MAXZIDE-25) 37.5-25 MG tablet TAKE 1 TABLET BY MOUTH EVERY DAY 30 tablet 1  . medroxyPROGESTERone (DEPO-PROVERA) 150 MG/ML injection Inject 1 mL (150 mg total) into the  muscle every 3 (three) months. 1 mL 3  . medroxyPROGESTERone (DEPO-PROVERA) 150 MG/ML injection Inject 1 mL (150 mg total) into the muscle every 3 (three) months. 1 mL 0   No facility-administered medications prior to visit.     Allergies  Allergen Reactions  . Latex Rash  . Amoxicillin Other (See Comments)    REACTION: Pt states she always gets yeast infection. Denies history of allergic rash or urticaria or any generalized reaction   . Losartan Other (See Comments)    Lightheadedness and dizziness  . Maxzide [Triamterene-Hctz] Other (See Comments)    dizziness  . Penicillins     ROS Review of Systems    Objective:    Physical Exam  Constitutional: She is oriented to person, place, and time. She appears well-developed and well-nourished.  HENT:  Head: Normocephalic and atraumatic.  Cardiovascular: Normal rate, regular rhythm and normal heart sounds.  Pulmonary/Chest: Effort normal and breath sounds normal.  Neurological: She is alert and oriented to person, place, and time.  Skin: Skin is warm and dry.  Psychiatric: She has a normal mood and affect. Her behavior is normal.    BP (!) 131/92   Pulse 92   Ht 5\' 4"  (1.626 m)   Wt 200 lb (90.7 kg)   SpO2 100%   BMI 34.33 kg/m  Wt Readings from Last 3 Encounters:  03/09/18 200 lb (90.7 kg)  02/09/18 203 lb (92.1 kg)  10/18/17 196 lb (88.9 kg)     There are no preventive care reminders to display for this patient.  There are no preventive care reminders to display for this patient.  Lab Results  Component Value Date   TSH 0.81 08/03/2016   Lab Results  Component Value Date   WBC 9.5 10/11/2017   HGB 13.5 10/11/2017   HCT 41.0 10/11/2017   MCV 88.2 10/11/2017   PLT 419 (H) 10/11/2017   Lab Results  Component Value Date   NA 137 10/11/2017   K 3.8 10/11/2017   CO2 25 10/11/2017   GLUCOSE 105 (H) 10/11/2017   BUN 13 10/11/2017   CREATININE 0.77 10/11/2017   BILITOT 0.3 10/11/2017   ALKPHOS 58  05/09/2015   AST 15 10/11/2017   ALT 14 10/11/2017   PROT 7.3 10/11/2017   ALBUMIN 4.3 05/09/2015   CALCIUM 9.5 10/11/2017   Lab Results  Component Value Date   CHOL 142 05/09/2015   Lab Results  Component Value Date   HDL 75 05/09/2015   Lab  Results  Component Value Date   LDLCALC 51 05/09/2015   Lab Results  Component Value Date   TRIG 81 05/09/2015   Lab Results  Component Value Date   CHOLHDL 1.9 05/09/2015   Lab Results  Component Value Date   HGBA1C 5.9 (A) 09/01/2017      Assessment & Plan:   Problem List Items Addressed This Visit      Cardiovascular and Mediastinum   Hypertension - Primary   Relevant Medications   hydrochlorothiazide (MICROZIDE) 12.5 MG capsule   amLODipine (NORVASC) 5 MG tablet   Other Relevant Orders   BASIC METABOLIC PANEL WITH GFR     Hypertension -she is now had dizziness on Maxide as well.  Will discontinue and start which is 12.5 mg of HCTZ but have her take start that for 1 week up to 10 days and if she does well then can add a low-dose of amlodipine..  She persistently has problems with her diastolic pressure so I think this will work well.  I will see her back in 3 weeks either with me or nurse visit if she is doing really well she will need a BMP at that time.  She is had low potassiums in the past.  Meds ordered this encounter  Medications  . hydrochlorothiazide (MICROZIDE) 12.5 MG capsule    Sig: Take 1 capsule (12.5 mg total) by mouth daily.    Dispense:  30 capsule    Refill:  3  . amLODipine (NORVASC) 5 MG tablet    Sig: Take 1 tablet (5 mg total) by mouth daily.    Dispense:  30 tablet    Refill:  3    Follow-up: Return in about 3 weeks (around 03/30/2018) for recheck BP with me or nurse visit.  Beatrice Lecher, MD

## 2018-03-30 ENCOUNTER — Ambulatory Visit: Payer: No Typology Code available for payment source

## 2018-04-04 ENCOUNTER — Ambulatory Visit: Payer: No Typology Code available for payment source

## 2018-04-07 ENCOUNTER — Telehealth: Payer: Self-pay

## 2018-04-07 NOTE — Telephone Encounter (Signed)
Error. Isola Mehlman,CMA  

## 2018-04-27 ENCOUNTER — Ambulatory Visit (INDEPENDENT_AMBULATORY_CARE_PROVIDER_SITE_OTHER): Payer: No Typology Code available for payment source | Admitting: Family Medicine

## 2018-04-27 VITALS — BP 115/81 | HR 105 | Wt 199.0 lb

## 2018-04-27 DIAGNOSIS — I10 Essential (primary) hypertension: Secondary | ICD-10-CM

## 2018-04-27 NOTE — Progress Notes (Signed)
Pt came into clinic today for NV BP check. At last OV she was stated on HCTZ 12.5mg  daily, and advised if tolerating well to add amlodipine 5mg  daily. She has been on both, did bring 3 pages of home readings. Values look good, reading in office today at goal. Copy of readings placed in PCP's box for scanning. Advised to continue current regime and I would contact with any updates.

## 2018-04-27 NOTE — Progress Notes (Signed)
HTN -BP is at goal today and at home. Continue with regimen. F/U ith me 5 months.

## 2018-04-27 NOTE — Progress Notes (Signed)
Pt advised. Verbalized understanding.

## 2018-04-29 ENCOUNTER — Telehealth: Payer: Self-pay | Admitting: Family Medicine

## 2018-04-29 NOTE — Telephone Encounter (Signed)
Pt was advised of recommendation at [facility] [place].

## 2018-04-29 NOTE — Telephone Encounter (Signed)
Call patient: Thank you for dropping off blood pressure log.  I think the blood pressures look absolutely fantastic with the addition of the 5 mg amlodipine.  Most of them are in the 120s and none of them are greater than 140 which is awesome.  As long as she is feeling well on it I think we will continue it and then plan to follow-up in the office in about 4 months.

## 2018-05-09 ENCOUNTER — Other Ambulatory Visit: Payer: Self-pay | Admitting: *Deleted

## 2018-05-09 MED ORDER — MEDROXYPROGESTERONE ACETATE 150 MG/ML IM SUSP: 150.0000 mg | INTRAMUSCULAR | 3 refills | Status: DC

## 2018-05-09 NOTE — Telephone Encounter (Signed)
RF on Depo Provera sent to [facility].  Pt is due for her annual but the appts are being deferred until the office reopens for annuals.

## 2018-07-07 ENCOUNTER — Telehealth: Payer: Self-pay | Admitting: *Deleted

## 2018-07-07 ENCOUNTER — Other Ambulatory Visit: Payer: Self-pay

## 2018-07-07 MED ORDER — HYDROCHLOROTHIAZIDE 12.5 MG PO CAPS: 12.5000 mg | ORAL_CAPSULE | Freq: Every day | ORAL | 1 refills | Status: DC

## 2018-07-07 NOTE — Telephone Encounter (Signed)
Left patient a message to call and answer screening questions prior to appointment on 07/11/18 at 9:00am. Informed of office policies also.

## 2018-07-10 ENCOUNTER — Other Ambulatory Visit: Payer: Self-pay | Admitting: Family Medicine

## 2018-07-11 ENCOUNTER — Ambulatory Visit (INDEPENDENT_AMBULATORY_CARE_PROVIDER_SITE_OTHER): Payer: No Typology Code available for payment source | Admitting: Obstetrics & Gynecology

## 2018-07-11 ENCOUNTER — Encounter: Payer: Self-pay | Admitting: Obstetrics & Gynecology

## 2018-07-11 ENCOUNTER — Other Ambulatory Visit: Payer: Self-pay

## 2018-07-11 VITALS — BP 118/81 | HR 102 | Resp 16 | Ht 64.0 in | Wt 201.0 lb

## 2018-07-11 DIAGNOSIS — N898 Other specified noninflammatory disorders of vagina: Secondary | ICD-10-CM

## 2018-07-11 DIAGNOSIS — Z01419 Encounter for gynecological examination (general) (routine) without abnormal findings: Secondary | ICD-10-CM | POA: Diagnosis not present

## 2018-07-11 DIAGNOSIS — B373 Candidiasis of vulva and vagina: Secondary | ICD-10-CM | POA: Diagnosis not present

## 2018-07-11 DIAGNOSIS — Z124 Encounter for screening for malignant neoplasm of cervix: Secondary | ICD-10-CM

## 2018-07-11 DIAGNOSIS — Z1151 Encounter for screening for human papillomavirus (HPV): Secondary | ICD-10-CM | POA: Diagnosis not present

## 2018-07-11 MED ORDER — MEDROXYPROGESTERONE ACETATE 150 MG/ML IM SUSP: 150.0000 mg | INTRAMUSCULAR | 4 refills | Status: DC

## 2018-07-11 NOTE — Addendum Note (Signed)
Addended by: Lyndal Rainbow on: 07/11/2018 10:15 AM   Modules accepted: Orders

## 2018-07-11 NOTE — Progress Notes (Signed)
Subjective:     [name] is a 38 y.o. female here for a routine exam.  Current complaints: none.  Happy with Depo Provera--has only had one episode of bleeding.  Pt works for [employer] and has RN administer injection every 12 weeks.    Gynecologic History No LMP recorded. Patient has had an injection. Contraception: Depo-Provera injections Last Pap: 2017. Results were: normal Last mammogram: 2013. Results were: Birads 2  Obstetric History OB History  Gravida Para Term Preterm AB Living  2 2 2     1   SAB TAB Ectopic Multiple Live Births          1    # Outcome Date GA Lbr Len/2nd Weight Sex Delivery Anes PTL Lv  2 Term 08/09/98   6 lb 3 oz (2.807 kg)  CS-LTranv  N LIV     Complications: Dysfunctional Labor  1 Term                 Review of Systems Pertinent items noted in HPI and remainder of comprehensive ROS otherwise negative.    Objective:      Vitals:   07/11/18 0904  BP: 118/81  Pulse: (!) 102  Resp: 16  Weight: 201 lb (91.2 kg)  Height: 5\' 4"  (1.626 m)   Vitals:  WNL General appearance: alert, cooperative and no distress  HEENT: Normocephalic, without obvious abnormality, atraumatic Eyes: negative Mouth not examined due to masking  Respiratory: Clear to auscultation bilaterally  CV: Regular rate and rhythm  Breasts:  Normal appearance, no masses or tenderness, no nipple retraction or dimpling  GI: Soft, non-tender; bowel sounds normal; no masses,  no organomegaly  GU: External Genitalia:  Tanner V, no lesion Urethra:  No prolapse   Vagina: Pink, normal rugae, no blood; thick white discharge  Cervix: No CMT, no lesion (cervix very anterior)  Uterus:  Normal size and contour, non tender--difficult to examine secondary to habitus.  Adnexa: Normal, no masses, non tender--difficult to examine secondary to habitus.  Musculoskeletal: No edema, redness or tenderness in the calves or thighs  Skin: No lesions or rash  Lymphatic: Axillary adenopathy: none      Psychiatric: Normal mood and behavior        Assessment:    Healthy female exam.    Plan:   1.  Pap with co-testing 2.  Continue depo provera 3.  Pt would like to start screening at 43 (mammogram).

## 2018-07-12 ENCOUNTER — Other Ambulatory Visit: Payer: Self-pay | Admitting: Obstetrics & Gynecology

## 2018-07-12 LAB — CERVICOVAGINAL ANCILLARY ONLY
Bacterial vaginitis: NEGATIVE
Candida vaginitis: POSITIVE — AB

## 2018-07-12 MED ORDER — FLUCONAZOLE 150 MG PO TABS: 150.0000 mg | ORAL_TABLET | Freq: Once | ORAL | 3 refills | Status: AC

## 2018-07-13 ENCOUNTER — Encounter: Payer: Self-pay | Admitting: Obstetrics & Gynecology

## 2018-07-13 ENCOUNTER — Telehealth: Payer: Self-pay | Admitting: *Deleted

## 2018-07-13 DIAGNOSIS — N870 Mild cervical dysplasia: Secondary | ICD-10-CM | POA: Insufficient documentation

## 2018-07-13 DIAGNOSIS — R87610 Atypical squamous cells of undetermined significance on cytologic smear of cervix (ASC-US): Secondary | ICD-10-CM | POA: Insufficient documentation

## 2018-07-13 LAB — CYTOLOGY - PAP
Diagnosis: UNDETERMINED — AB
HPV: DETECTED — AB

## 2018-07-13 NOTE — Telephone Encounter (Signed)
-----   Message from Guss Bunde, MD sent at 07/13/2018  3:27 PM EDT ----- ASCUS with +HPV.  Pt needs colposcopy for further evaluation.  Please call patient to schedule appt

## 2018-07-13 NOTE — Telephone Encounter (Signed)
LM on voicemail to call office for her pap results.  I did let her know she will need a colposcopy for further evaluation.

## 2018-07-14 ENCOUNTER — Encounter: Payer: Self-pay | Admitting: *Deleted

## 2018-08-01 ENCOUNTER — Encounter: Payer: No Typology Code available for payment source | Admitting: Obstetrics & Gynecology

## 2018-08-20 ENCOUNTER — Other Ambulatory Visit: Payer: Self-pay | Admitting: Family Medicine

## 2018-10-15 ENCOUNTER — Other Ambulatory Visit: Payer: Self-pay | Admitting: Family Medicine

## 2018-10-25 ENCOUNTER — Other Ambulatory Visit: Payer: Self-pay | Admitting: Family Medicine

## 2018-11-14 ENCOUNTER — Other Ambulatory Visit: Payer: Self-pay | Admitting: Family Medicine

## 2018-11-25 ENCOUNTER — Other Ambulatory Visit: Payer: Self-pay | Admitting: Family Medicine

## 2018-12-22 ENCOUNTER — Other Ambulatory Visit: Payer: Self-pay | Admitting: Family Medicine

## 2019-01-25 ENCOUNTER — Other Ambulatory Visit: Payer: Self-pay | Admitting: Family Medicine

## 2019-02-04 ENCOUNTER — Other Ambulatory Visit: Payer: Self-pay | Admitting: Family Medicine

## 2019-03-14 ENCOUNTER — Telehealth (INDEPENDENT_AMBULATORY_CARE_PROVIDER_SITE_OTHER): Payer: No Typology Code available for payment source | Admitting: Family Medicine

## 2019-03-14 ENCOUNTER — Encounter: Payer: Self-pay | Admitting: Family Medicine

## 2019-03-14 VITALS — Wt 200.0 lb

## 2019-03-14 DIAGNOSIS — I10 Essential (primary) hypertension: Secondary | ICD-10-CM

## 2019-03-14 DIAGNOSIS — R7301 Impaired fasting glucose: Secondary | ICD-10-CM | POA: Diagnosis not present

## 2019-03-14 DIAGNOSIS — K2100 Gastro-esophageal reflux disease with esophagitis, without bleeding: Secondary | ICD-10-CM

## 2019-03-14 DIAGNOSIS — Z1231 Encounter for screening mammogram for malignant neoplasm of breast: Secondary | ICD-10-CM

## 2019-03-14 MED ORDER — AMLODIPINE BESYLATE 10 MG PO TABS: 10.0000 mg | ORAL_TABLET | Freq: Every day | ORAL | 1 refills | Status: DC

## 2019-03-14 MED ORDER — HYDROCHLOROTHIAZIDE 12.5 MG PO CAPS: 12.5000 mg | ORAL_CAPSULE | Freq: Every day | ORAL | 1 refills | Status: DC

## 2019-03-14 NOTE — Assessment & Plan Note (Addendum)
Overdue for A1c check.  Last 1 was 5.9 and that was in August 2019.  So definitely need to get some updated labs.  She says she is really trying to drink a lot more water soda she tries to drink a diet soda.

## 2019-03-14 NOTE — Progress Notes (Signed)
Virtual Visit via telephone note  I connected with [name] on 03/14/19 at 11:30 AM EST by a video enabled telemedicine application and verified that I am speaking with the correct person using two identifiers.  We were unable to get the video component to work so this was converted to a telephone visit.  I discussed the limitations of evaluation and management by telemedicine and the availability of in person appointments. The patient expressed understanding and agreed to proceed.  Subjective:    CC:   HPI: Hypertension- Pt denies chest pain, SOB, dizziness, or heart palpitations.  Taking meds as directed w/o problems.  Denies medication side effects.    Impaired fasting glucose-no increased thirst or urination. No symptoms consistent with hypoglycemia.  F/U GERD  - see ntoe below.     Past medical history, Surgical history, Family history not pertinant except as noted below, Social history, Allergies, and medications have been entered into the medical record, reviewed, and corrections made.   Review of Systems: No fevers, chills, night sweats, weight loss, chest pain, or shortness of breath.   Objective:    General: Speaking clearly in complete sentences without any shortness of breath.  Alert and oriented x3.  Normal judgment. No apparent acute distress.    Impression and Recommendations:    IFG (impaired fasting glucose) Overdue for A1c check.  Last 1 was 5.9 and that was in August 2019.  So definitely need to get some updated labs.  She says she is really trying to drink a lot more water soda she tries to drink a diet soda.  Hypertension Uncontrolled.  Due for updated labs.  Checking BP at home patiently but unfortunately she has been getting some high blood pressures at home typically in the low 150s over 80s.  She says she is actually doing better with taking her medications more consistently but still misses an occasional dose.  She is currently on HCTZ 12.5 mg daily as  well as amlodipine 5 mg daily.  She does feel like the diuretic causes her to urinate more frequently.  Will increase amlodipine to 10 mg.  Did warn about potential for lower extremity swelling and to keep an eye out for this.  Plan will be to follow-up in 6 weeks.  GERD Says she does take her PPI almost every day when she takes it it does seem to control her symptoms well but if she does not she definitely has persistent symptoms.  We discussed that if at any point she is having breakthrough symptoms to please let us know because we would consider GI referral at that time.    Time spent in encounter 21 minutes  I discussed the assessment and treatment plan with the patient. The patient was provided an opportunity to ask questions and all were answered. The patient agreed with the plan and demonstrated an understanding of the instructions.   The patient was advised to call back or seek an in-person evaluation if the symptoms worsen or if the condition fails to improve as anticipated.   Beatrice Lecher, MD

## 2019-03-14 NOTE — Assessment & Plan Note (Addendum)
Uncontrolled.  Due for updated labs.  Checking BP at home patiently but unfortunately she has been getting some high blood pressures at home typically in the low 150s over 80s.  She says she is actually doing better with taking her medications more consistently but still misses an occasional dose.  She is currently on HCTZ 12.5 mg daily as well as amlodipine 5 mg daily.  She does feel like the diuretic causes her to urinate more frequently.  Will increase amlodipine to 10 mg.  Did warn about potential for lower extremity swelling and to keep an eye out for this.  Plan will be to follow-up in 6 weeks.

## 2019-03-14 NOTE — Assessment & Plan Note (Signed)
Says she does take her PPI almost every day when she takes it it does seem to control her symptoms well but if she does not she definitely has persistent symptoms.  We discussed that if at any point she is having breakthrough symptoms to please let us know because we would consider GI referral at that time.

## 2019-03-15 ENCOUNTER — Telehealth: Payer: No Typology Code available for payment source | Admitting: Family Medicine

## 2019-03-15 ENCOUNTER — Encounter: Payer: Self-pay | Admitting: Family Medicine

## 2019-03-15 ENCOUNTER — Telehealth (INDEPENDENT_AMBULATORY_CARE_PROVIDER_SITE_OTHER): Payer: No Typology Code available for payment source | Admitting: Family Medicine

## 2019-03-15 DIAGNOSIS — N760 Acute vaginitis: Secondary | ICD-10-CM | POA: Diagnosis not present

## 2019-03-15 MED ORDER — METRONIDAZOLE 500 MG PO TABS: 500.0000 mg | ORAL_TABLET | Freq: Two times a day (BID) | ORAL | 0 refills | Status: DC

## 2019-03-15 MED ORDER — FLUCONAZOLE 150 MG PO TABS: 150.0000 mg | ORAL_TABLET | Freq: Every day | ORAL | 1 refills | Status: DC

## 2019-03-15 NOTE — Progress Notes (Signed)
Virtual Visit via Video Note  I connected with [name] on 03/15/19 at  9:10 AM EST by a video enabled telemedicine application and verified that I am speaking with the correct person using two identifiers.   I discussed the limitations of evaluation and management by telemedicine and the availability of in person appointments. The patient expressed understanding and agreed to proceed.  Subjective:    CC:   HPI: Her sxs began on Sunday with a vaginal discharge. She denies any abdominal cramping, burning/itching, or pain w/urination. She said that the d/c is white and has a fishy odor.  There is a little bit of tenderness when she wipes but no bleeding.  She has been taking some Tylenol she does have a little bit of mild pelvic discomfort.  No fevers, chills or sweats.  She hasn't tried any OTC medication for this.  Last time she had a yeast infection in July 2020 when she had her Pap smear done.    Past medical history, Surgical history, Family history not pertinant except as noted below, Social history, Allergies, and medications have been entered into the medical record, reviewed, and corrections made.   Review of Systems: No fevers, chills, night sweats, weight loss, chest pain, or shortness of breath.   Objective:    General: Speaking clearly in complete sentences without any shortness of breath.  Alert and oriented x3.  Normal judgment. No apparent acute distress.    Impression and Recommendations:    No problem-specific Assessment & Plan notes found for this encounter.  Vaginitis-with irritation and white discharge she could possibly have a yeast infection, but with a fishy odor it is also possible she has bacterial vaginitis.  We will go ahead and treat for both currently.  If not improving over the next week then will need to make an appointment for further evaluation.     Time spent in encounter 18 minutes  I discussed the assessment and treatment plan with the  patient. The patient was provided an opportunity to ask questions and all were answered. The patient agreed with the plan and demonstrated an understanding of the instructions.   The patient was advised to call back or seek an in-person evaluation if the symptoms worsen or if the condition fails to improve as anticipated.   Beatrice Lecher, MD

## 2019-03-15 NOTE — Progress Notes (Signed)
Her sxs began on Sunday. She denies any abdominal cramping, burning/itching, or pain w/urination. She said that the d/c is white and has a fishy odor.   She hasn't tried any OTC medication for this.

## 2019-09-09 ENCOUNTER — Other Ambulatory Visit: Payer: Self-pay | Admitting: Obstetrics & Gynecology

## 2019-09-20 ENCOUNTER — Other Ambulatory Visit: Payer: Self-pay | Admitting: *Deleted

## 2019-09-20 MED ORDER — MEDROXYPROGESTERONE ACETATE 150 MG/ML IM SUSP: 150.0000 mg | INTRAMUSCULAR | 0 refills | Status: DC

## 2019-09-20 NOTE — Telephone Encounter (Signed)
Pt called for a RF on her Depo Provera 150 mg.  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  She is overdue for her annual so appt made for 10/05/19.  1 RF of Depo sent to [facility] and pt to bring with her the day of her annual appt.

## 2019-10-02 ENCOUNTER — Other Ambulatory Visit: Payer: Self-pay | Admitting: Family Medicine

## 2019-10-02 DIAGNOSIS — I10 Essential (primary) hypertension: Secondary | ICD-10-CM

## 2019-10-05 ENCOUNTER — Other Ambulatory Visit (HOSPITAL_COMMUNITY)
Admission: RE | Admit: 2019-10-05 | Discharge: 2019-10-05 | Disposition: A | Discharge status: Home / Self Care | Payer: 59 | Source: Ambulatory Visit | Attending: Obstetrics and Gynecology | Admitting: Obstetrics and Gynecology

## 2019-10-05 ENCOUNTER — Other Ambulatory Visit: Payer: Self-pay

## 2019-10-05 ENCOUNTER — Encounter: Payer: Self-pay | Admitting: Obstetrics and Gynecology

## 2019-10-05 ENCOUNTER — Ambulatory Visit (INDEPENDENT_AMBULATORY_CARE_PROVIDER_SITE_OTHER): Payer: 59 | Admitting: Obstetrics and Gynecology

## 2019-10-05 VITALS — BP 124/76 | HR 99 | Resp 16 | Ht 62.0 in | Wt 208.0 lb

## 2019-10-05 DIAGNOSIS — Z124 Encounter for screening for malignant neoplasm of cervix: Secondary | ICD-10-CM

## 2019-10-05 DIAGNOSIS — Z3009 Encounter for other general counseling and advice on contraception: Secondary | ICD-10-CM

## 2019-10-05 DIAGNOSIS — Z1231 Encounter for screening mammogram for malignant neoplasm of breast: Secondary | ICD-10-CM | POA: Diagnosis not present

## 2019-10-05 DIAGNOSIS — Z01419 Encounter for gynecological examination (general) (routine) without abnormal findings: Secondary | ICD-10-CM

## 2019-10-05 DIAGNOSIS — Z7189 Other specified counseling: Secondary | ICD-10-CM

## 2019-10-05 DIAGNOSIS — Z3042 Encounter for surveillance of injectable contraceptive: Secondary | ICD-10-CM | POA: Diagnosis not present

## 2019-10-05 MED ORDER — MEDROXYPROGESTERONE ACETATE 150 MG/ML IM SUSP
150.0000 mg | INTRAMUSCULAR | Status: AC
  Administered 2019-10-05: 150 mg via INTRAMUSCULAR

## 2019-10-05 NOTE — Progress Notes (Signed)
GYNECOLOGY ANNUAL PREVENTATIVE CARE ENCOUNTER NOTE  Subjective:   [name] is a 39 y.o. G43P2001 female here for a annual gynecologic exam. Current complaints: none.    Denies abnormal vaginal bleeding, discharge, pelvic pain, problems with intercourse or other gynecologic concerns. Declines STI screen.   Gynecologic History No LMP recorded. Patient has had an injection. Contraception: Depo-Provera injections Last Pap: 2020. Results: ASCUS, +hrHPV Last mammogram: 2013. Results: Birads 2 DEXA: has never had  Obstetric History OB History  Gravida Para Term Preterm AB Living  2 2 2     1   SAB TAB Ectopic Multiple Live Births          1    # Outcome Date GA Lbr Len/2nd Weight Sex Delivery Anes PTL Lv  2 Term 08/09/98   6 lb 3 oz (2.807 kg)  CS-LTranv  N LIV     Complications: Dysfunctional Labor  1 Term             Past Medical History:  Diagnosis Date  . GERD (gastroesophageal reflux disease)   . Headache   . Herpes simplex without mention of complication    daily acyclovir  . Vaginal yeast infection    recurrent   Past Surgical History:  Procedure Laterality Date  . BREAST SURGERY Bilateral    benign cysts  . CESAREAN SECTION     x 1  . HYSTEROSCOPY N/A 07/02/2017   Procedure: OPERATIVE HYSTEROSCOPY;  Surgeon: Guss Bunde, MD;  Location: [facility];  Service: Gynecology;  Laterality: N/A;  . HYSTEROSCOPY WITH D & C N/A 07/02/2017   Procedure: DILATATION AND CURETTAGE /HYSTEROSCOPY;  Surgeon: Guss Bunde, MD;  Location: [facility];  Service: Gynecology;  Laterality: N/A;  . IUD REMOVAL N/A 07/02/2017   Procedure: INTRAUTERINE DEVICE (IUD) REMOVAL;  Surgeon: Guss Bunde, MD;  Location: [facility];  Service: Gynecology;  Laterality: N/A;  . WISDOM TOOTH EXTRACTION      Current Outpatient Medications on File Prior to Visit  Medication Sig Dispense Refill  . amLODipine (NORVASC) 10 MG tablet TAKE 1 TABLET BY MOUTH EVERY DAY 30 tablet 5  .  aspirin-acetaminophen-caffeine (EXCEDRIN MIGRAINE) 177-939-03 MG tablet Take 2 tablets by mouth every 6 (six) hours as needed for headache.    . dicyclomine (BENTYL) 20 MG tablet Take 20 mg by mouth 3 (three) times daily as needed.    . hydrochlorothiazide (MICROZIDE) 12.5 MG capsule TAKE 1 CAPSULE BY MOUTH EVERY DAY 30 capsule 5  . linaclotide (LINZESS) 145 MCG CAPS capsule Take by mouth.    . medroxyPROGESTERone (DEPO-PROVERA) 150 MG/ML injection Inject 1 mL (150 mg total) into the muscle every 3 (three) months. 1 mL 0  . pantoprazole (PROTONIX) 40 MG tablet Take by mouth.     No current facility-administered medications on file prior to visit.    Allergies  Allergen Reactions  . Latex Rash  . Amoxicillin Other (See Comments)    REACTION: Pt states she always gets yeast infection. Denies history of allergic rash or urticaria or any generalized reaction   . Losartan Other (See Comments)    Lightheadedness and dizziness  . Maxzide [Triamterene-Hctz] Other (See Comments)    dizziness  . Penicillins     Social History   Socioeconomic History  . Marital status: Single    Spouse name: Not on file  . Number of children: Not on file  . Years of education: Not on file  . Highest education level: Not on  file  Occupational History    Comment: [employer]  Tobacco Use  . Smoking status: Never Smoker  . Smokeless tobacco: Never Used  Vaping Use  . Vaping Use: Never used  Substance and Sexual Activity  . Alcohol use: No  . Drug use: No  . Sexual activity: Yes    Partners: Male    Birth control/protection: Injection    Comment: Mirena/Depo Injection  Other Topics Concern  . Not on file  Social History Narrative  . Not on file   Social Determinants of Health   Financial Resource Strain:   . Difficulty of Paying Living Expenses: Not on file  Food Insecurity:   . Worried About Charity fundraiser in the Last Year: Not on file  . Ran Out of Food in the Last Year: Not on  file  Transportation Needs:   . Lack of Transportation (Medical): Not on file  . Lack of Transportation (Non-Medical): Not on file  Physical Activity:   . Days of Exercise per Week: Not on file  . Minutes of Exercise per Session: Not on file  Stress:   . Feeling of Stress : Not on file  Social Connections:   . Frequency of Communication with Friends and Family: Not on file  . Frequency of Social Gatherings with Friends and Family: Not on file  . Attends Religious Services: Not on file  . Active Member of Clubs or Organizations: Not on file  . Attends Archivist Meetings: Not on file  . Marital Status: Not on file  Intimate Partner Violence:   . Fear of Current or Ex-Partner: Not on file  . Emotionally Abused: Not on file  . Physically Abused: Not on file  . Sexually Abused: Not on file    Family History  Problem Relation Age of Onset  . Cancer Maternal Aunt        breast  . Cancer Maternal Aunt        breast   The following portions of the patient's history were reviewed and updated as appropriate: allergies, current medications, past family history, past medical history, past social history, past surgical history and problem list.  Review of Systems Pertinent items are noted in HPI.   Objective:  BP 124/76   Pulse 99   Resp 16   Ht 5\' 2"  (1.575 m)   Wt 208 lb (94.3 kg)   BMI 38.04 kg/m  CONSTITUTIONAL: Well-developed, well-nourished female in no acute distress.  HENT:  Normocephalic, atraumatic, External right and left ear normal. Oropharynx is clear and moist EYES: Conjunctivae and EOM are normal. Pupils are equal, round, and reactive to light. No scleral icterus.  NECK: Normal range of motion, supple, no masses.  Normal thyroid.  SKIN: Skin is warm and dry. No rash noted. Not diaphoretic. No erythema. No pallor. NEUROLOGIC: Alert and oriented to person, place, and time. Normal reflexes, muscle tone coordination. No cranial nerve deficit  noted. PSYCHIATRIC: Normal mood and affect. Normal behavior. Normal judgment and thought content. CARDIOVASCULAR: Normal heart rate noted RESPIRATORY: Effort normal, no problems with respiration noted. BREASTS: Symmetric in size. No masses, skin changes, nipple drainage, or lymphadenopathy. ABDOMEN: Soft, no distention noted.  No tenderness, rebound or guarding.  PELVIC: Normal appearing external genitalia; normal appearing vaginal mucosa and cervix.  No abnormal discharge noted.  Pap smear obtained. Normal uterine size, no other palpable masses, no uterine or adnexal tenderness. MUSCULOSKELETAL: Normal range of motion. No tenderness.  No cyanosis, clubbing, or edema.  2+ distal pulses.  Exam done with chaperone present.   Assessment and Plan:   1. Well woman exam Healthy female exam - Cytology - PAP( [place])  2. Encounter for screening mammogram for malignant neoplasm of breast - MM DIAG BREAST TOMO BILATERAL; Future  3. Cervical cancer screening ASCUS with +hrHPV last year, colpo recommended and not done -reviewed this with patient - repeat pap today - Cytology - PAP( [place])  4. Encounter for counseling regarding contraception Happy with depo Will continue Discussed risks with long term depo use  5. Counseled about COVID-19 virus infection COVID-19 Vaccine Counseling: The patient was counseled on the potential benefits and lack of known risks of COVID vaccination, during pregnancy and breastfeeding, during today's visit. The patient's questions and concerns were addressed today, including safety of the vaccination and potential side effects as they have been published by ACOG and [facility]. The patient has been informed that there have not been any documented vaccine related injuries, deaths or birth defects to infant or mom after receiving the COVID-19 vaccine to date. The patient has been made aware that although she is not at increased risk of contracting COVID-19 during  pregnancy, she is at increased risk of developing severe disease and complications if she contracts COVID-19 while pregnant. All patient questions were addressed during our visit today. The patient is not planning to get vaccinated at this time.     Will follow up results of pap smear and manage accordingly. Encouraged improvement in diet and exercise.  COVID vaccine declines Declines STI screen. Mammogram ordered Referral for colonoscopy na/  Routine preventative health maintenance measures emphasized. Please refer to After Visit Summary for other counseling recommendations.   Total face-to-face time with patient: 22 minutes. Over 50% of encounter was spent on counseling and coordination of care.   Feliz Beam, M.D. Attending Center for [employer] [facility] farm manager)

## 2019-10-08 LAB — CYTOLOGY - PAP
Adequacy: ABSENT
Chlamydia: NEGATIVE
Comment: NEGATIVE
Comment: NEGATIVE
Comment: NORMAL
High risk HPV: POSITIVE — AB
Neisseria Gonorrhea: NEGATIVE

## 2019-10-19 ENCOUNTER — Other Ambulatory Visit: Payer: Self-pay | Admitting: Family Medicine

## 2019-10-24 ENCOUNTER — Other Ambulatory Visit: Payer: Self-pay | Admitting: Obstetrics and Gynecology

## 2019-10-24 DIAGNOSIS — N649 Disorder of breast, unspecified: Secondary | ICD-10-CM

## 2019-11-17 ENCOUNTER — Telehealth: Payer: Self-pay | Admitting: *Deleted

## 2019-11-17 NOTE — Telephone Encounter (Signed)
Left patient a message to call the office about insurance.

## 2019-11-20 ENCOUNTER — Other Ambulatory Visit: Payer: Self-pay | Admitting: Obstetrics & Gynecology

## 2019-11-20 ENCOUNTER — Encounter: Payer: Self-pay | Admitting: Obstetrics & Gynecology

## 2019-11-20 ENCOUNTER — Other Ambulatory Visit: Payer: Self-pay

## 2019-11-20 ENCOUNTER — Ambulatory Visit: Payer: No Typology Code available for payment source | Admitting: Obstetrics & Gynecology

## 2019-11-20 VITALS — BP 117/80 | HR 100 | Resp 16 | Ht 62.0 in | Wt 208.0 lb

## 2019-11-20 DIAGNOSIS — R87612 Low grade squamous intraepithelial lesion on cytologic smear of cervix (LGSIL): Secondary | ICD-10-CM

## 2019-11-20 DIAGNOSIS — Z01812 Encounter for preprocedural laboratory examination: Secondary | ICD-10-CM

## 2019-11-20 LAB — POCT URINE PREGNANCY: Preg Test, Ur: NEGATIVE

## 2019-11-20 NOTE — Progress Notes (Signed)
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  Prior cervical/vaginal disease: normal exam without visible pathology. Prior cervical treatment: no treatment.  Procedure Details  The risks and benefits of the procedure and Written informed consent obtained.  Speculum placed in vagina and visualization of cervix achieved although difficult due to patient comfort with speculum, cervix swabbed x 3 with acetic acid solution.  TZ difficult to visualize.  Findings: Cervix: acetowhite lesion(s) noted at 2 o'clock; cervix swabbed with Lugol's solution, endocervical speculum placed, cervical biopsies taken at 2 o'clock, specimen labelled and sent to pathology and hemostasis achieved with Monsel's solution. Vaginal inspection: vaginal colposcopy not performed. Vulvar colposcopy: vulvar colposcopy not performed.  Specimens: Biopsy at 2 o'clock and ECC  Complications: none.  Plan: Specimens labelled and sent to Pathology. Will base further treatment on Pathology findings. Treatment options discussed with patient. Post biopsy instructions given to patient.

## 2019-12-15 ENCOUNTER — Encounter: Payer: Self-pay | Admitting: Obstetrics & Gynecology

## 2019-12-20 ENCOUNTER — Other Ambulatory Visit: Payer: Self-pay | Admitting: *Deleted

## 2019-12-20 MED ORDER — MEDROXYPROGESTERONE ACETATE 150 MG/ML IM SUSP: 150.0000 mg | INTRAMUSCULAR | 2 refills | Status: DC

## 2019-12-20 NOTE — Telephone Encounter (Signed)
Pt called requesting a RF on Depo Provera.  Her last annual was in Sept 21.  RF[facility].

## 2020-04-26 ENCOUNTER — Other Ambulatory Visit: Payer: Self-pay | Admitting: Family Medicine

## 2020-04-26 DIAGNOSIS — I10 Essential (primary) hypertension: Secondary | ICD-10-CM

## 2020-05-10 ENCOUNTER — Other Ambulatory Visit: Payer: Self-pay | Admitting: Family Medicine

## 2020-05-10 DIAGNOSIS — I10 Essential (primary) hypertension: Secondary | ICD-10-CM

## 2020-05-10 NOTE — Telephone Encounter (Signed)
Please call pt for f/u for bp and fasting labs thank you

## 2020-05-10 NOTE — Telephone Encounter (Signed)
Called patient to scheduled appt & she said she wanted to wait until she got something with her insurance figured out (Stated she has to do a re-assessment with them? Wasn't sure what it was called. ) AM

## 2020-07-15 ENCOUNTER — Other Ambulatory Visit: Payer: Self-pay | Admitting: Family Medicine

## 2020-07-15 DIAGNOSIS — I10 Essential (primary) hypertension: Secondary | ICD-10-CM

## 2020-07-28 ENCOUNTER — Other Ambulatory Visit: Payer: Self-pay | Admitting: Family Medicine

## 2020-07-28 DIAGNOSIS — I10 Essential (primary) hypertension: Secondary | ICD-10-CM

## 2020-07-29 NOTE — Telephone Encounter (Signed)
Follow up appt scheduled for 08/08/20. AM

## 2020-07-29 NOTE — Telephone Encounter (Signed)
Pt will need a f/u appointment for any refills.

## 2020-08-08 ENCOUNTER — Ambulatory Visit: Payer: No Typology Code available for payment source | Admitting: Family Medicine

## 2020-08-08 ENCOUNTER — Encounter: Payer: Self-pay | Admitting: Family Medicine

## 2020-08-08 ENCOUNTER — Ambulatory Visit (INDEPENDENT_AMBULATORY_CARE_PROVIDER_SITE_OTHER): Payer: No Typology Code available for payment source | Admitting: Family Medicine

## 2020-08-08 ENCOUNTER — Other Ambulatory Visit: Payer: Self-pay

## 2020-08-08 VITALS — BP 119/78 | HR 99 | Temp 99.0°F | Ht 62.0 in | Wt 215.0 lb

## 2020-08-08 DIAGNOSIS — I10 Essential (primary) hypertension: Secondary | ICD-10-CM | POA: Diagnosis not present

## 2020-08-08 DIAGNOSIS — R102 Pelvic and perineal pain: Secondary | ICD-10-CM

## 2020-08-08 DIAGNOSIS — N760 Acute vaginitis: Secondary | ICD-10-CM

## 2020-08-08 DIAGNOSIS — R350 Frequency of micturition: Secondary | ICD-10-CM | POA: Diagnosis not present

## 2020-08-08 DIAGNOSIS — R5383 Other fatigue: Secondary | ICD-10-CM

## 2020-08-08 DIAGNOSIS — R7301 Impaired fasting glucose: Secondary | ICD-10-CM | POA: Diagnosis not present

## 2020-08-08 DIAGNOSIS — B9689 Other specified bacterial agents as the cause of diseases classified elsewhere: Secondary | ICD-10-CM

## 2020-08-08 MED ORDER — AMLODIPINE BESYLATE 10 MG PO TABS: 10.0000 mg | ORAL_TABLET | Freq: Every day | ORAL | 1 refills | Status: DC

## 2020-08-08 MED ORDER — HYDROCHLOROTHIAZIDE 12.5 MG PO CAPS: 12.5000 mg | ORAL_CAPSULE | Freq: Every day | ORAL | 1 refills | Status: DC

## 2020-08-08 MED ORDER — METRONIDAZOLE 1.3 % VA GEL: 1.0000 "application " | Freq: Every day | VAGINAL | 1 refills | Status: DC

## 2020-08-08 NOTE — Assessment & Plan Note (Signed)
Lan to recheck hemoglobin A1c.  Its been a couple years since it was last checked again she is not had blood work since 2019.

## 2020-08-08 NOTE — Progress Notes (Signed)
Established Patient Office Visit  Subjective:  Patient ID: [name], female    DOB: Jul 30, 1980  Age: 40 y.o. MRN: [id number]  CC: No chief complaint on file.   HPI [name] presents for   Hypertension- Pt denies chest pain, SOB, dizziness, or heart palpitations.  Taking meds as directed w/o problems.  Denies medication side effects.    Impaired fasting glucose-no increased thirst or urination. No symptoms consistent with hypoglycemia.  She also reports just feeling fatigued.  She says about mid week she just feels completely exhausted she does have a prior history of iron deficiency anemia and has not had a checked in quite some time friend mentions starting B12.  She does work long 12-hour shifts and says sometimes she is just exhausted she comes home and sleeps immediately.  She also complains of some pelvic pain which she describes as a pressure.  She is also having urinary frequency but no burning with urination.  Denies any vaginal discharge though she would like a refill the metronidazole gel which she uses from time to time no new sexual partners.  No abnormal bleeding.  She reports that she is still dealing with frequent loose bowel movements and "blowouts" at work.  She recently started a probiotic called Culturelle and that seems to be helping some but is planning on trying to get back in with GI when she can.   Past Medical History:  Diagnosis Date   GERD (gastroesophageal reflux disease)    Headache    Herpes simplex without mention of complication    daily acyclovir   Vaginal yeast infection    recurrent    Past Surgical History:  Procedure Laterality Date   BREAST SURGERY Bilateral    benign cysts   CESAREAN SECTION     x 1   HYSTEROSCOPY N/A 07/02/2017   Procedure: OPERATIVE HYSTEROSCOPY;  Surgeon: Guss Bunde, MD;  Location: [facility];  Service: Gynecology;  Laterality: N/A;   HYSTEROSCOPY WITH D & C N/A 07/02/2017   Procedure: DILATATION AND  CURETTAGE /HYSTEROSCOPY;  Surgeon: Guss Bunde, MD;  Location: [facility];  Service: Gynecology;  Laterality: N/A;   IUD REMOVAL N/A 07/02/2017   Procedure: INTRAUTERINE DEVICE (IUD) REMOVAL;  Surgeon: Guss Bunde, MD;  Location: [facility];  Service: Gynecology;  Laterality: N/A;   WISDOM TOOTH EXTRACTION      Family History  Problem Relation Age of Onset   Cancer Maternal Aunt        breast   Cancer Maternal Aunt        breast    Social History   Socioeconomic History   Marital status: Single    Spouse name: Not on file   Number of children: Not on file   Years of education: Not on file   Highest education level: Not on file  Occupational History    Comment: [employer]  Tobacco Use   Smoking status: Never   Smokeless tobacco: Never  Vaping Use   Vaping Use: Never used  Substance and Sexual Activity   Alcohol use: No   Drug use: No   Sexual activity: Yes    Partners: Male    Birth control/protection: Injection    Comment: Mirena/Depo Injection  Other Topics Concern   Not on file  Social History Narrative   Not on file   Social Determinants of Health   Financial Resource Strain: Not on file  Food Insecurity: Not on file  Transportation Needs:  Not on file  Physical Activity: Not on file  Stress: Not on file  Social Connections: Not on file  Intimate Partner Violence: Not on file    Outpatient Medications Prior to Visit  Medication Sig Dispense Refill   aspirin-acetaminophen-caffeine (EXCEDRIN MIGRAINE) 250-250-65 MG tablet Take 2 tablets by mouth every 6 (six) hours as needed for headache.     dicyclomine (BENTYL) 20 MG tablet Take 20 mg by mouth 3 (three) times daily as needed.     linaclotide (LINZESS) 145 MCG CAPS capsule Take by mouth.     medroxyPROGESTERone (DEPO-PROVERA) 150 MG/ML injection Inject 1 mL (150 mg total) into the muscle every 3 (three) months. 1 mL 2   pantoprazole (PROTONIX) 40 MG tablet Take by mouth.     amLODipine (NORVASC) 10 MG  tablet TAKE 1 TABLET (10 MG TOTAL) BY MOUTH DAILY. NEEDS APPT 15 tablet 0   hydrochlorothiazide (MICROZIDE) 12.5 MG capsule TAKE 1 CAPSULE (12.5 MG TOTAL) BY MOUTH DAILY. NEEDS APPT 15 capsule 0   No facility-administered medications prior to visit.    Allergies  Allergen Reactions   Latex Rash   Amoxicillin Other (See Comments)    REACTION: Pt states she always gets yeast infection. Denies history of allergic rash or urticaria or any generalized reaction    Losartan Other (See Comments)    Lightheadedness and dizziness   Maxzide [Triamterene-Hctz] Other (See Comments)    dizziness   Penicillins     ROS Review of Systems    Objective:    Physical Exam Constitutional:      Appearance: Normal appearance. She is well-developed.  HENT:     Head: Normocephalic and atraumatic.  Cardiovascular:     Rate and Rhythm: Normal rate and regular rhythm.     Heart sounds: Normal heart sounds.  Pulmonary:     Effort: Pulmonary effort is normal.     Breath sounds: Normal breath sounds.  Skin:    General: Skin is warm and dry.  Neurological:     Mental Status: She is alert and oriented to person, place, and time.  Psychiatric:        Behavior: Behavior normal.    BP 119/78   Pulse 99   Temp 99 F (37.2 C) (Oral)   Ht '5\' 2"'$  (1.575 m)   Wt 215 lb (97.5 kg)   SpO2 99% Comment: on RA  BMI 39.32 kg/m  Wt Readings from Last 3 Encounters:  08/08/20 215 lb (97.5 kg)  11/20/19 208 lb (94.3 kg)  10/05/19 208 lb (94.3 kg)     Health Maintenance Due  Topic Date Due   COVID-19 Vaccine (1) Never done   Hepatitis C Screening  Never done   INFLUENZA VACCINE  08/05/2020    There are no preventive care reminders to display for this patient.  Lab Results  Component Value Date   TSH 0.81 08/03/2016   Lab Results  Component Value Date   WBC 9.5 10/11/2017   HGB 13.5 10/11/2017   HCT 41.0 10/11/2017   MCV 88.2 10/11/2017   PLT 419 (H) 10/11/2017   Lab Results  Component  Value Date   NA 137 10/11/2017   K 3.8 10/11/2017   CO2 25 10/11/2017   GLUCOSE 105 (H) 10/11/2017   BUN 13 10/11/2017   CREATININE 0.77 10/11/2017   BILITOT 0.3 10/11/2017   ALKPHOS 58 05/09/2015   AST 15 10/11/2017   ALT 14 10/11/2017   PROT 7.3 10/11/2017   ALBUMIN 4.3 05/09/2015  CALCIUM 9.5 10/11/2017   Lab Results  Component Value Date   CHOL 142 05/09/2015   Lab Results  Component Value Date   HDL 75 05/09/2015   Lab Results  Component Value Date   LDLCALC 51 05/09/2015   Lab Results  Component Value Date   TRIG 81 05/09/2015   Lab Results  Component Value Date   CHOLHDL 1.9 05/09/2015   Lab Results  Component Value Date   HGBA1C 5.9 (A) 09/01/2017      Assessment & Plan:   Problem List Items Addressed This Visit       Cardiovascular and Mediastinum   Hypertension    Well controlled. Continue current regimen. Follow up in  6 mo. overdue for lab work so really wanted to get that updated today if possible.       Relevant Medications   amLODipine (NORVASC) 10 MG tablet   hydrochlorothiazide (MICROZIDE) 12.5 MG capsule     Endocrine   IFG (impaired fasting glucose) - Primary    Lan to recheck hemoglobin A1c.  Its been a couple years since it was last checked again she is not had blood work since 2019.       Relevant Orders   CBC   COMPLETE METABOLIC PANEL WITH GFR   Lipid panel   TSH   Fe+TIBC+Fer   B12     Genitourinary   Recurrent BV (bacterial vaginosis)    Will refill metronidazole.         Other Visit Diagnoses     Essential hypertension       Relevant Medications   amLODipine (NORVASC) 10 MG tablet   hydrochlorothiazide (MICROZIDE) 12.5 MG capsule   Other Relevant Orders   CBC   COMPLETE METABOLIC PANEL WITH GFR   Lipid panel   TSH   Fe+TIBC+Fer   B12   Pelvic pressure in female       Relevant Orders   CBC   COMPLETE METABOLIC PANEL WITH GFR   Lipid panel   TSH   Fe+TIBC+Fer   B12   Urinalysis, microscopic  only   Urinary frequency       Relevant Orders   CBC   COMPLETE METABOLIC PANEL WITH GFR   Lipid panel   TSH   Fe+TIBC+Fer   B12   Urinalysis, microscopic only   Fatigue, unspecified type       Relevant Orders   CBC   COMPLETE METABOLIC PANEL WITH GFR   Lipid panel   TSH   Fe+TIBC+Fer   B12       Pelvic pressure/urinary frequency-consider UTI. Declined wet prep today. Urinalysis actually looks okay today.  Fatigue-evaluate for anemia: Iron deficiency and B12 deficiency.  We will check thyroid level as well. Unclear etiology. She is working long hours.  Mood is OK.    Meds ordered this encounter  Medications   amLODipine (NORVASC) 10 MG tablet    Sig: Take 1 tablet (10 mg total) by mouth daily.    Dispense:  90 tablet    Refill:  1   hydrochlorothiazide (MICROZIDE) 12.5 MG capsule    Sig: Take 1 capsule (12.5 mg total) by mouth daily. Needs appt    Dispense:  90 capsule    Refill:  1   metroNIDAZOLE 1.3 % GEL    Sig: Place 1 application vaginally at bedtime.    Dispense:  5 g    Refill:  1    Follow-up: Return in about 6 months (around 02/08/2021) for  Hypertension.    Beatrice Lecher, MD

## 2020-08-08 NOTE — Assessment & Plan Note (Signed)
Will refill metronidazole.

## 2020-08-08 NOTE — Assessment & Plan Note (Signed)
Well controlled. Continue current regimen. Follow up in  6 mo. overdue for lab work so really wanted to get that updated today if possible.

## 2020-08-09 LAB — COMPLETE METABOLIC PANEL WITH GFR
AG Ratio: 1.5 (calc) (ref 1.0–2.5)
ALT: 16 U/L (ref 6–29)
AST: 16 U/L (ref 10–30)
Albumin: 4.5 g/dL (ref 3.6–5.1)
Alkaline phosphatase (APISO): 81 U/L (ref 31–125)
BUN: 12 mg/dL (ref 7–25)
CO2: 24 mmol/L (ref 20–32)
Calcium: 9.5 mg/dL (ref 8.6–10.2)
Chloride: 105 mmol/L (ref 98–110)
Creat: 0.76 mg/dL (ref 0.50–0.99)
Globulin: 3.1 g/dL (calc) (ref 1.9–3.7)
Glucose, Bld: 115 mg/dL — ABNORMAL HIGH (ref 65–99)
Potassium: 3.6 mmol/L (ref 3.5–5.3)
Sodium: 140 mmol/L (ref 135–146)
Total Bilirubin: 0.3 mg/dL (ref 0.2–1.2)
Total Protein: 7.6 g/dL (ref 6.1–8.1)
eGFR: 100 mL/min/{1.73_m2} (ref >=60)

## 2020-08-09 LAB — VITAMIN B12: Vitamin B-12: 607 pg/mL (ref 200–1100)

## 2020-08-09 LAB — CBC
HCT: 38.6 % (ref 35.0–45.0)
Hemoglobin: 12.6 g/dL (ref 11.7–15.5)
MCH: 29.3 pg (ref 27.0–33.0)
MCHC: 32.6 g/dL (ref 32.0–36.0)
MCV: 89.8 fL (ref 80.0–100.0)
MPV: 9.9 fL (ref 7.5–12.5)
Platelets: 444 10*3/uL — ABNORMAL HIGH (ref 140–400)
RBC: 4.3 10*6/uL (ref 3.80–5.10)
RDW: 13.9 % (ref 11.0–15.0)
WBC: 9.2 10*3/uL (ref 3.8–10.8)

## 2020-08-09 LAB — IRON,TIBC AND FERRITIN PANEL
%SAT: 25 % (calc) (ref 16–45)
Ferritin: 235 ng/mL — ABNORMAL HIGH (ref 16–232)
Iron: 75 ug/dL (ref 40–190)
TIBC: 295 mcg/dL (calc) (ref 250–450)

## 2020-08-09 LAB — LIPID PANEL
Cholesterol: 152 mg/dL (ref <200)
HDL: 60 mg/dL (ref >=50)
LDL Cholesterol (Calc): 77 mg/dL (calc)
Non-HDL Cholesterol (Calc): 92 mg/dL (calc) (ref <130)
Total CHOL/HDL Ratio: 2.5 (calc) (ref <5.0)
Triglycerides: 69 mg/dL (ref <150)

## 2020-08-09 LAB — TSH: TSH: 1.25 mIU/L

## 2020-08-10 LAB — URINALYSIS, MICROSCOPIC ONLY: Hyaline Cast: NONE SEEN /LPF

## 2020-08-12 MED ORDER — SULFAMETHOXAZOLE-TRIMETHOPRIM 800-160 MG PO TABS: 1.0000 | ORAL_TABLET | Freq: Two times a day (BID) | ORAL | 0 refills | Status: DC

## 2020-08-12 NOTE — Addendum Note (Signed)
Addended by: Beatrice Lecher D on: 08/12/2020 10:08 AM   Modules accepted: Orders

## 2020-09-05 ENCOUNTER — Other Ambulatory Visit: Payer: Self-pay | Admitting: Obstetrics and Gynecology

## 2020-10-03 ENCOUNTER — Other Ambulatory Visit: Payer: Self-pay | Admitting: Family Medicine

## 2020-10-06 ENCOUNTER — Encounter: Payer: Self-pay | Admitting: Family Medicine

## 2020-10-08 MED ORDER — METRONIDAZOLE 0.75 % VA GEL: Freq: Every day | VAGINAL | 2 refills | Status: DC

## 2020-10-09 NOTE — Telephone Encounter (Signed)
I am not really sure what she is asking?  I thought she was wanting to go back to it because she saying that the other 1 was more expensive.  So I am personally confused.  We may need to call her to better clarify what question she is asking.  Does she want to go back on the other 1?

## 2020-11-18 ENCOUNTER — Encounter: Payer: Self-pay | Admitting: Family Medicine

## 2020-11-18 ENCOUNTER — Other Ambulatory Visit: Payer: Self-pay

## 2020-11-18 ENCOUNTER — Ambulatory Visit (INDEPENDENT_AMBULATORY_CARE_PROVIDER_SITE_OTHER): Payer: No Typology Code available for payment source | Admitting: Family Medicine

## 2020-11-18 VITALS — BP 113/69 | HR 102 | Ht 62.0 in | Wt 215.0 lb

## 2020-11-18 DIAGNOSIS — E739 Lactose intolerance, unspecified: Secondary | ICD-10-CM | POA: Diagnosis not present

## 2020-11-18 DIAGNOSIS — Z23 Encounter for immunization: Secondary | ICD-10-CM

## 2020-11-18 DIAGNOSIS — K581 Irritable bowel syndrome with constipation: Secondary | ICD-10-CM

## 2020-11-18 MED ORDER — LUBIPROSTONE 8 MCG PO CAPS: 8.0000 ug | ORAL_CAPSULE | Freq: Two times a day (BID) | ORAL | 1 refills | Status: DC

## 2020-11-18 NOTE — Assessment & Plan Note (Signed)
Reviewed diagnosis with her today.  Also reviewed notes from digestive health including the x-ray that they did.  Wee discussed options.  She feels like the lower dose Linzess was not effective but the 145 dose was too strong and caused nausea and vomiting.  We discussed maybe a trial of Amitiza she has not used that yet.  We will start with 8 mg twice a day she can send me a note in a month if she feels like we might need to adjust her dose we also discussed increasing fiber in her diet with vegetables and fruits.  She is already really ramped up her water intake.  This should help as well.  Looking we discussed the goal of having more regular and consistent days.  But that it would not be a full cure.  She is mostly concerned because her symptoms are extremely disruptive to work.

## 2020-11-18 NOTE — Progress Notes (Signed)
Established Patient Office Visit  Subjective:  Patient ID: [name], female    DOB: [birth year]  Age: 40 y.o. MRN: [id number]  CC:  Chief Complaint  Patient presents with   Follow-up    HPI TRINITI GRUETZMACHER presents for   Recently seen by [facility] for ongoing symptoms of abdominal pain, diarrhea and cramping.  She has a known history of chronic constipation.  She has symptoms and still urgency and sometimes can take in this 90 minutes to evacuate her bowels.  I recommended a purge over the weekend after they saw her to really just clean her bowels out by taking MiraLAX daily starting with stool softeners and then recommended a trial of dicyclomine up to 4 times a day.  They did do an x-ray on 1020 as well showing severe fecal retention in the right and transverse colon.  The left colon looked relatively decompressed.  Is having at least 2 bad episodes per week usually at work.  This is causing a lot of stress.    Past Medical History:  Diagnosis Date   GERD (gastroesophageal reflux disease)    Headache    Herpes simplex without mention of complication    daily acyclovir   Vaginal yeast infection    recurrent    Past Surgical History:  Procedure Laterality Date   BREAST SURGERY Bilateral    benign cysts   CESAREAN SECTION     x 1   HYSTEROSCOPY N/A 07/02/2017   Procedure: OPERATIVE HYSTEROSCOPY;  Surgeon: Guss Bunde, MD;  Location: [facility];  Service: Gynecology;  Laterality: N/A;   HYSTEROSCOPY WITH D & C N/A 07/02/2017   Procedure: DILATATION AND CURETTAGE /HYSTEROSCOPY;  Surgeon: Guss Bunde, MD;  Location: [facility];  Service: Gynecology;  Laterality: N/A;   IUD REMOVAL N/A 07/02/2017   Procedure: INTRAUTERINE DEVICE (IUD) REMOVAL;  Surgeon: Guss Bunde, MD;  Location: [facility];  Service: Gynecology;  Laterality: N/A;   WISDOM TOOTH EXTRACTION      Family History  Problem Relation Age of Onset   Cancer Maternal Aunt        breast    Cancer Maternal Aunt        breast    Social History   Socioeconomic History   Marital status: Single    Spouse name: Not on file   Number of children: Not on file   Years of education: Not on file   Highest education level: Not on file  Occupational History    Comment: [employer]  Tobacco Use   Smoking status: Never   Smokeless tobacco: Never  Vaping Use   Vaping Use: Never used  Substance and Sexual Activity   Alcohol use: No   Drug use: No   Sexual activity: Yes    Partners: Male    Birth control/protection: Injection    Comment: Mirena/Depo Injection  Other Topics Concern   Not on file  Social History Narrative   Not on file   Social Determinants of Health   Financial Resource Strain: Not on file  Food Insecurity: Not on file  Transportation Needs: Not on file  Physical Activity: Not on file  Stress: Not on file  Social Connections: Not on file  Intimate Partner Violence: Not on file    Outpatient Medications Prior to Visit  Medication Sig Dispense Refill   amLODipine (NORVASC) 10 MG tablet Take 1 tablet (10 mg total) by mouth daily. 90 tablet 1   aspirin-acetaminophen-caffeine (  EXCEDRIN MIGRAINE) 250-250-65 MG tablet Take 2 tablets by mouth every 6 (six) hours as needed for headache.     dicyclomine (BENTYL) 20 MG tablet Take 20 mg by mouth 3 (three) times daily as needed.     hydrochlorothiazide (MICROZIDE) 12.5 MG capsule Take 1 capsule (12.5 mg total) by mouth daily. Needs appt 90 capsule 1   medroxyPROGESTERone (DEPO-PROVERA) 150 MG/ML injection INJECT 1 ML (150 MG TOTAL) INTO THE MUSCLE EVERY 3 (THREE) MONTHS 1 mL 2   metroNIDAZOLE (METROGEL) 0.75 % vaginal gel Place vaginally at bedtime. 70 g 2   pantoprazole (PROTONIX) 40 MG tablet Take by mouth.     linaclotide (LINZESS) 145 MCG CAPS capsule Take by mouth.     sulfamethoxazole-trimethoprim (BACTRIM DS) 800-160 MG tablet Take 1 tablet by mouth 2 (two) times daily. 6 tablet 0   No  facility-administered medications prior to visit.    Allergies  Allergen Reactions   Latex Rash   Amoxicillin Other (See Comments)    REACTION: Pt states she always gets yeast infection. Denies history of allergic rash or urticaria or any generalized reaction    Linzess [Linaclotide] Nausea And Vomiting    On the 171mcg dose.  Lower dose ineffective.     Losartan Other (See Comments)    Lightheadedness and dizziness   Maxzide [Triamterene-Hctz] Other (See Comments)    dizziness   Penicillins     ROS Review of Systems    Objective:    Physical Exam  BP 113/69   Pulse (!) 102   Ht $R'5\' 2"'rd$  (1.575 m)   Wt 215 lb (97.5 kg)   SpO2 100%   BMI 39.32 kg/m  Wt Readings from Last 3 Encounters:  11/18/20 215 lb (97.5 kg)  08/08/20 215 lb (97.5 kg)  11/20/19 208 lb (94.3 kg)     Health Maintenance Due  Topic Date Due   COVID-19 Vaccine (1) Never done   Hepatitis C Screening  Never done    There are no preventive care reminders to display for this patient.  Lab Results  Component Value Date   TSH 1.25 08/08/2020   Lab Results  Component Value Date   WBC 9.2 08/08/2020   HGB 12.6 08/08/2020   HCT 38.6 08/08/2020   MCV 89.8 08/08/2020   PLT 444 (H) 08/08/2020   Lab Results  Component Value Date   NA 140 08/08/2020   K 3.6 08/08/2020   CO2 24 08/08/2020   GLUCOSE 115 (H) 08/08/2020   BUN 12 08/08/2020   CREATININE 0.76 08/08/2020   BILITOT 0.3 08/08/2020   ALKPHOS 58 05/09/2015   AST 16 08/08/2020   ALT 16 08/08/2020   PROT 7.6 08/08/2020   ALBUMIN 4.3 05/09/2015   CALCIUM 9.5 08/08/2020   EGFR 100 08/08/2020   Lab Results  Component Value Date   CHOL 152 08/08/2020   Lab Results  Component Value Date   HDL 60 08/08/2020   Lab Results  Component Value Date   LDLCALC 77 08/08/2020   Lab Results  Component Value Date   TRIG 69 08/08/2020   Lab Results  Component Value Date   CHOLHDL 2.5 08/08/2020   Lab Results  Component Value Date    HGBA1C 5.9 (A) 09/01/2017      Assessment & Plan:   Problem List Items Addressed This Visit       Digestive   Irritable bowel syndrome with constipation    Reviewed diagnosis with her today.  Also reviewed notes from  digestive health including the x-ray that they did.  Wee discussed options.  She feels like the lower dose Linzess was not effective but the 145 dose was too strong and caused nausea and vomiting.  We discussed maybe a trial of Amitiza she has not used that yet.  We will start with 8 mg twice a day she can send me a note in a month if she feels like we might need to adjust her dose we also discussed increasing fiber in her diet with vegetables and fruits.  She is already really ramped up her water intake.  This should help as well.  Looking we discussed the goal of having more regular and consistent days.  But that it would not be a full cure.  She is mostly concerned because her symptoms are extremely disruptive to work.      Relevant Medications   lubiprostone (AMITIZA) 8 MCG capsule     Other   Lactose intolerance   Other Visit Diagnoses     Need for immunization against influenza    -  Primary   Relevant Orders   Flu Vaccine QUAD 19moIM (Fluarix, Fluzone & Alfiuria Quad PF) (Completed)       Meds ordered this encounter  Medications   lubiprostone (AMITIZA) 8 MCG capsule    Sig: Take 1 capsule (8 mcg total) by mouth 2 (two) times daily with a meal.    Dispense:  60 capsule    Refill:  1    Follow-up: No follow-ups on file.    CBeatrice Lecher MD

## 2020-11-18 NOTE — Telephone Encounter (Signed)
Let me see if it might need a prior authorization.  Cyril Mourning can you check on this?

## 2020-12-26 ENCOUNTER — Ambulatory Visit: Payer: No Typology Code available for payment source | Admitting: Obstetrics & Gynecology

## 2021-02-18 ENCOUNTER — Telehealth: Payer: Self-pay | Admitting: Family Medicine

## 2021-02-18 NOTE — Telephone Encounter (Signed)
Pt emailed a work physical form to fill out. Patient is aware that there may be a charge for the completion of the form. Form put in Dr's box.

## 2021-02-18 NOTE — Telephone Encounter (Signed)
I cannot complete the form because I do need to have an up-to-date PPD.  If she had it done somewhere else then if they can fax it to her office and I can confirm it and sign off on it.  Plus the only vaccines I have listed her for her are her flu shot and her tetanus I do not have a complete shot record.  Did she have that done somewhere else?  Were happy to get it entered.  Or if she needs blood work to prove that she has had vaccines were happy to draw that as well.

## 2021-02-20 NOTE — Telephone Encounter (Signed)
She states she only needs to top part of the form filled out. She had a PPD at the urgent care. The vaccine information doesn't need to be filled out.

## 2021-02-20 NOTE — Telephone Encounter (Signed)
Patient given form today.

## 2021-04-01 ENCOUNTER — Other Ambulatory Visit: Payer: Self-pay | Admitting: Family Medicine

## 2021-04-01 DIAGNOSIS — I10 Essential (primary) hypertension: Secondary | ICD-10-CM

## 2021-04-06 ENCOUNTER — Other Ambulatory Visit: Payer: Self-pay | Admitting: Family Medicine

## 2021-04-06 DIAGNOSIS — I10 Essential (primary) hypertension: Secondary | ICD-10-CM

## 2021-04-21 ENCOUNTER — Other Ambulatory Visit: Payer: Self-pay | Admitting: Family Medicine

## 2021-04-21 DIAGNOSIS — I10 Essential (primary) hypertension: Secondary | ICD-10-CM

## 2021-04-23 ENCOUNTER — Other Ambulatory Visit: Payer: Self-pay | Admitting: Family Medicine

## 2021-04-23 DIAGNOSIS — I10 Essential (primary) hypertension: Secondary | ICD-10-CM

## 2021-05-05 ENCOUNTER — Ambulatory Visit: Payer: No Typology Code available for payment source | Admitting: Obstetrics & Gynecology

## 2021-05-06 ENCOUNTER — Other Ambulatory Visit: Payer: Self-pay | Admitting: Family Medicine

## 2021-05-06 DIAGNOSIS — I10 Essential (primary) hypertension: Secondary | ICD-10-CM

## 2021-05-12 ENCOUNTER — Ambulatory Visit (INDEPENDENT_AMBULATORY_CARE_PROVIDER_SITE_OTHER): Payer: No Typology Code available for payment source | Admitting: Obstetrics & Gynecology

## 2021-05-12 ENCOUNTER — Encounter: Payer: Self-pay | Admitting: Obstetrics & Gynecology

## 2021-05-12 ENCOUNTER — Other Ambulatory Visit (HOSPITAL_COMMUNITY)
Admission: RE | Admit: 2021-05-12 | Discharge: 2021-05-12 | Disposition: A | Discharge status: Home / Self Care | Payer: No Typology Code available for payment source | Source: Ambulatory Visit | Attending: Obstetrics & Gynecology | Admitting: Obstetrics & Gynecology

## 2021-05-12 VITALS — BP 116/83 | HR 103 | Ht 62.0 in | Wt 216.0 lb

## 2021-05-12 DIAGNOSIS — Z3042 Encounter for surveillance of injectable contraceptive: Secondary | ICD-10-CM | POA: Insufficient documentation

## 2021-05-12 DIAGNOSIS — Z01419 Encounter for gynecological examination (general) (routine) without abnormal findings: Secondary | ICD-10-CM

## 2021-05-12 MED ORDER — MEDROXYPROGESTERONE ACETATE 150 MG/ML IM SUSP: 150.0000 mg | INTRAMUSCULAR | 6 refills | Status: DC

## 2021-05-12 NOTE — Progress Notes (Signed)
? ?  ANNUAL GYN EXAM ?Patient name: [name] MRN [id number]  Date of birth: 1980-03-27 ?Chief Complaint:   ?41 yo female presents for yearly exam ? ?History of Present Illness:   ?[name] is a 41 y.o. G88P2001 African-American female being seen today for a routine annual exam.  ?Current complaints: none ? ?No LMP recorded. Patient has had an injection. ? ? ?The pregnancy intention screening data noted above was reviewed. Potential methods of contraception were discussed. The patient elected to proceed with abstinence ? 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 ? ?  08/08/2020  ? 11:25 AM 03/14/2019  ? 11:24 AM 10/11/2017  ?  9:41 AM 09/01/2017  ? 10:35 AM  ?Depression screen PHQ 2/9  ?Decreased Interest 0 0 0 0  ?Down, Depressed, Hopeless 0 0 0 0  ?PHQ - 2 Score 0 0 0 0  ? ?  ?   ? View : No data to display.  ?  ?  ?  ? ? ? ?Review of Systems:   ?Pertinent items are noted in HPI ?Denies any headaches, blurred vision, fatigue, shortness of breath, chest pain, abdominal pain, abnormal vaginal discharge/itching/odor/irritation,  ?Being treated for IBS ?Would like to get back on depo--periods much better ?Pertinent History Reviewed:  ?Reviewed past medical,surgical, social and family history.  ?Reviewed problem list, medications and allergies. ?Physical Assessment:  ?There were no vitals filed for this visit.There is no height or weight on file to calculate BMI. ?  ?     Physical Examination:  ? General appearance - well appearing, and in no distress ? Mental status - alert, oriented to person, place, and time ? Psych:  She has a normal mood and affect ? Skin - warm and dry, normal color, no suspicious lesions noted ? Chest - effort normal, all lung fields clear to auscultation bilaterally ? Heart - normal rate and regular rhythm ? Neck:  midline trachea, no  thyromegaly or nodules ? Breasts - breasts appear normal, no suspicious masses, no skin or nipple changes or  axillary nodes ? Abdomen - soft, nontender, nondistended, no masses or organomegaly ? Pelvic - VULVA: normal appearing vulva with no masses, tenderness or lesions  VAGINA: normal appearing vagina with normal color and discharge, no lesions  CERVIX: normal appearing cervix without discharge or lesions, no CMT ? Thin prep pap is done with HR HPV cotesting ? UTERUS: uterus is felt to be normal size, shape, consistency and nontender  ? ADNEXA: No adnexal masses or tenderness noted. ? Extremities:  No swelling or varicosities noted ? ?Chaperone present for exam ? ?No results found for this or any previous visit (from the past 24 hour(s)).  ?Assessment & Plan:  ?1) Well-Woman Exam ? ?2) Pap with cotesting ? ?3)  Mammogram ordered ? 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 ? ?Colonoscopy: @ 41yo, or sooner if problems ? ?No orders of the defined types were placed in this encounter. ? ? ?Meds: No orders of the defined types were placed in this encounter. ? ? ?Follow-up: 1 year ? ?Asencion Islam, RN ?05/12/2021 ?1:15 PM  ?

## 2021-05-12 NOTE — Progress Notes (Signed)
Last Mammogram: In her 105's ?Last Pap Smear:  2021 ?Last Colon Screening;  n/a ?Seat Belts:   Yes ?Sun Screen:   No ?Dental Check Up:  Yes ?Brush & Floss:  Yes  ?

## 2021-05-14 ENCOUNTER — Ambulatory Visit (INDEPENDENT_AMBULATORY_CARE_PROVIDER_SITE_OTHER): Payer: No Typology Code available for payment source

## 2021-05-14 ENCOUNTER — Encounter: Payer: Self-pay | Admitting: Family Medicine

## 2021-05-14 ENCOUNTER — Ambulatory Visit (INDEPENDENT_AMBULATORY_CARE_PROVIDER_SITE_OTHER): Payer: No Typology Code available for payment source | Admitting: Family Medicine

## 2021-05-14 VITALS — BP 128/86 | HR 90 | Resp 16 | Ht 62.0 in | Wt 218.0 lb

## 2021-05-14 DIAGNOSIS — R0789 Other chest pain: Secondary | ICD-10-CM

## 2021-05-14 DIAGNOSIS — Z23 Encounter for immunization: Secondary | ICD-10-CM | POA: Diagnosis not present

## 2021-05-14 DIAGNOSIS — G4700 Insomnia, unspecified: Secondary | ICD-10-CM

## 2021-05-14 DIAGNOSIS — R0602 Shortness of breath: Secondary | ICD-10-CM

## 2021-05-14 DIAGNOSIS — R079 Chest pain, unspecified: Secondary | ICD-10-CM | POA: Diagnosis not present

## 2021-05-14 DIAGNOSIS — F439 Reaction to severe stress, unspecified: Secondary | ICD-10-CM

## 2021-05-14 LAB — CBC WITH DIFFERENTIAL/PLATELET
Absolute Monocytes: 893 cells/uL (ref 200–950)
Basophils Absolute: 34 cells/uL (ref 0–200)
Basophils Relative: 0.4 %
Eosinophils Absolute: 230 cells/uL (ref 15–500)
Eosinophils Relative: 2.7 %
HCT: 40.6 % (ref 35.0–45.0)
Hemoglobin: 13.7 g/dL (ref 11.7–15.5)
Lymphs Abs: 2712 cells/uL (ref 850–3900)
MCH: 30.3 pg (ref 27.0–33.0)
MCHC: 33.7 g/dL (ref 32.0–36.0)
MCV: 89.8 fL (ref 80.0–100.0)
MPV: 9.9 fL (ref 7.5–12.5)
Monocytes Relative: 10.5 %
Neutro Abs: 4633 cells/uL (ref 1500–7800)
Neutrophils Relative %: 54.5 %
Platelets: 368 10*3/uL (ref 140–400)
RBC: 4.52 10*6/uL (ref 3.80–5.10)
RDW: 13.1 % (ref 11.0–15.0)
Total Lymphocyte: 31.9 %
WBC: 8.5 10*3/uL (ref 3.8–10.8)

## 2021-05-14 LAB — COMPLETE METABOLIC PANEL WITH GFR
AG Ratio: 1.4 (calc) (ref 1.0–2.5)
ALT: 14 U/L (ref 6–29)
AST: 16 U/L (ref 10–30)
Albumin: 4.5 g/dL (ref 3.6–5.1)
Alkaline phosphatase (APISO): 87 U/L (ref 31–125)
BUN: 12 mg/dL (ref 7–25)
CO2: 24 mmol/L (ref 20–32)
Calcium: 9.2 mg/dL (ref 8.6–10.2)
Chloride: 104 mmol/L (ref 98–110)
Creat: 0.82 mg/dL (ref 0.50–0.99)
Globulin: 3.3 g/dL (calc) (ref 1.9–3.7)
Glucose, Bld: 104 mg/dL — ABNORMAL HIGH (ref 65–99)
Potassium: 3.7 mmol/L (ref 3.5–5.3)
Sodium: 139 mmol/L (ref 135–146)
Total Bilirubin: 0.3 mg/dL (ref 0.2–1.2)
Total Protein: 7.8 g/dL (ref 6.1–8.1)
eGFR: 91 mL/min/{1.73_m2} (ref >=60)

## 2021-05-14 LAB — D-DIMER, QUANTITATIVE: D-Dimer, Quant: 0.19 mcg/mL FEU (ref <0.50)

## 2021-05-14 LAB — CK: Total CK: 111 U/L (ref 29–143)

## 2021-05-14 MED ORDER — TRAZODONE HCL 50 MG PO TABS: 25.0000 mg | ORAL_TABLET | Freq: Every evening | ORAL | 3 refills | Status: DC | PRN

## 2021-05-14 NOTE — Progress Notes (Signed)
? ?Acute Office Visit ? ?Subjective:  ? ?  ?Patient ID: [name], female    DOB: 04-18-1980, 41 y.o.   MRN: [id number] ? ?Chief Complaint  ?Patient presents with  ? Chest Pain  ?  Off and on for 1 month, shortness of breath. Sharp pain.   ? ? ?HPI ?Patient is in today for CP on and off x 1 months.  She says the chest pain is typically right-sided and describes it as sharp.  Usually when it happens she will take an aspirin or 2.  It usually last about an hour.  She says she will get a little short of breath when it happens.  It seems to be random and can happen during the day or at night it can happen with activity or rest.  So its not consistent.  She denies any radiation of the pain in either upper side down her arm or into her back.  She sees me does take a multivitamin and B12.  Occasionally she will feel nauseated with it but she has not noticed any relationship to eating or not eating.  No recent upper respiratory symptoms or wheezing.  She does not have shortness of breath otherwise.  She reports that she has been really stressed over the last several months and has not been sleeping well.  She usually works about a 12-hour shift and on average she is getting about 4 hours of sleep at night.  She will typically wake up in the middle the night and then cannot go back to sleep for some time. ? ?Her major stressor right now is her son who is 28.  He moved out about a year and a half ago but recently he has been getting into some legal trouble and lost his job so she has been financially supporting him in paying for lawyer and so she is actually been working overtime to be able to do this for him. ? ?ROS ? ? ?   ?Objective:  ?  ?BP 128/86   Pulse 90   Resp 16   Ht '5\' 2"'$  (1.575 m)   Wt 218 lb (98.9 kg)   SpO2 95%   BMI 39.87 kg/m?  ? ? ?Physical Exam ?Vitals and nursing note reviewed.  ?Constitutional:   ?   Appearance: She is well-developed.  ?HENT:  ?   Head: Normocephalic and atraumatic.   ?Cardiovascular:  ?   Rate and Rhythm: Normal rate and regular rhythm.  ?   Heart sounds: Normal heart sounds.  ?Pulmonary:  ?   Effort: Pulmonary effort is normal.  ?   Breath sounds: Normal breath sounds.  ?Musculoskeletal:  ?   Cervical back: Neck supple.  ?Lymphadenopathy:  ?   Cervical: No cervical adenopathy.  ?Skin: ?   General: Skin is warm and dry.  ?Neurological:  ?   Mental Status: She is alert and oriented to person, place, and time.  ?Psychiatric:     ?   Behavior: Behavior normal.  ? ? ?No results found for any visits on 05/14/21. ? ? ?   ?Assessment & Plan:  ? ?Problem List Items Addressed This Visit   ?None ?Visit Diagnoses   ? ? Immunization due    -  Primary  ? Relevant Orders  ? Tdap vaccine greater than or equal to 7yo IM (Completed)  ? Atypical chest pain      ? Relevant Orders  ? DG Chest 2 View  ? CBC with Differential/Platelet  ?  COMPLETE METABOLIC PANEL WITH GFR  ? D-Dimer, Quantitative  ? CK (Creatine Kinase)  ? SOB (shortness of breath)      ? Relevant Orders  ? DG Chest 2 View  ? CBC with Differential/Platelet  ? COMPLETE METABOLIC PANEL WITH GFR  ? D-Dimer, Quantitative  ? CK (Creatine Kinase)  ? Stress at home      ? Insomnia, unspecified type      ? Relevant Medications  ? traZODone (DESYREL) 50 MG tablet  ? ?  ? ?Atypical chest pain/shortness of breath-I think its less likely to be cardiac or GI related based on her history of symptoms.  But I do think it is prudent to at least do a minimal work-up including EKG, labs and chest x-ray.  EKG today shows rate of 86 bpm, normal sinus rhythm with no acute ST-T wave changes.  Call with lab and chest x-ray results once available.  I do think it is probably being triggered by stress. ? ?Stress at home-discussed a couple of options.  We will start with trazodone one half to a whole tab as needed at bedtime to see if it helps slightly with mood and sleep I like to see her back in 6 weeks to see if she is feeling gets helpful.  If she feels  like it is causing excess sedation or headaches then okay to stop the medication and please let us know.  Consider therapy/counseling I think that could actually be really helpful for her as well.  Urged regular exercise as well. ? ?Hot flashes-she is also reports that she has been starting to get some hot flashes especially at night.  We discussed getting back on track with her exercise and getting back into walking.  If it persists or continues then we can always work-up further.  She says she will try to get back in to schedule a physical. ? ?Meds ordered this encounter  ?Medications  ? traZODone (DESYREL) 50 MG tablet  ?  Sig: Take 0.5-1 tablets (25-50 mg total) by mouth at bedtime as needed for sleep.  ?  Dispense:  30 tablet  ?  Refill:  3  ? ? ?Return in about 6 weeks (around 06/25/2021). ? ?Beatrice Lecher, MD ? ? ?

## 2021-05-14 NOTE — Patient Instructions (Signed)
Let me know if at any point you feel like it would be helpful to talk with someone. ?

## 2021-05-15 NOTE — Progress Notes (Signed)
Great news!  Chest x-ray is normal.

## 2021-05-15 NOTE — Progress Notes (Signed)
Hi [name],  ?Your labs look great overall.  Everything really looks reassuring.  No sign of a blood clot or muscle breakdown.  I do think some of this could be stress related.  Try the medication at night for sleep and let see if that is helping over the next couple of weeks.

## 2021-05-16 LAB — CYTOLOGY - PAP
Comment: NEGATIVE
High risk HPV: POSITIVE — AB

## 2021-05-16 NOTE — Addendum Note (Signed)
Addended by: Narda Rutherford on: 05/16/2021 10:12 AM ? ? Modules accepted: Orders ? ?

## 2021-06-05 ENCOUNTER — Other Ambulatory Visit: Payer: Self-pay | Admitting: Family Medicine

## 2021-06-05 DIAGNOSIS — G4700 Insomnia, unspecified: Secondary | ICD-10-CM

## 2021-06-09 ENCOUNTER — Other Ambulatory Visit: Payer: Self-pay | Admitting: Family Medicine

## 2021-06-09 DIAGNOSIS — I10 Essential (primary) hypertension: Secondary | ICD-10-CM

## 2021-06-16 ENCOUNTER — Encounter: Payer: No Typology Code available for payment source | Admitting: Obstetrics & Gynecology

## 2021-07-03 ENCOUNTER — Encounter: Payer: Self-pay | Admitting: Family Medicine

## 2021-07-03 ENCOUNTER — Ambulatory Visit (INDEPENDENT_AMBULATORY_CARE_PROVIDER_SITE_OTHER): Payer: No Typology Code available for payment source | Admitting: Family Medicine

## 2021-07-03 VITALS — BP 117/79 | HR 79 | Resp 16 | Ht 62.0 in | Wt 209.0 lb

## 2021-07-03 DIAGNOSIS — B9689 Other specified bacterial agents as the cause of diseases classified elsewhere: Secondary | ICD-10-CM | POA: Diagnosis not present

## 2021-07-03 DIAGNOSIS — N760 Acute vaginitis: Secondary | ICD-10-CM

## 2021-07-03 LAB — WET PREP FOR TRICH, YEAST, CLUE
MICRO NUMBER:: 13588795
Specimen Quality: ADEQUATE

## 2021-07-03 NOTE — Progress Notes (Signed)
   Acute Office Visit  Subjective:     Patient ID: [name], female    DOB: Nov 22, 1980, 42 y.o.   MRN: [id number]  Chief Complaint  Patient presents with   Vaginal Itching    Patient complains of vaginal itching and discharge for 2-3 days.     HPI Patient is in today for vaginal itching.  She says she has noticed a little change in discharge its been a little bit more white in color.  A little bit of discomfort.  No fevers or chills.  She did recently have intercourse with an ex partner and so would like STD testing as well.  ROS      Objective:    BP 117/79   Pulse 79   Resp 16   Ht '5\' 2"'$  (1.575 m)   Wt 209 lb (94.8 kg)   SpO2 99%   BMI 38.23 kg/m    Physical Exam Vitals reviewed. Exam conducted with a chaperone present.  Constitutional:      Appearance: She is well-developed.  HENT:     Head: Normocephalic and atraumatic.  Eyes:     Conjunctiva/sclera: Conjunctivae normal.  Cardiovascular:     Rate and Rhythm: Normal rate.  Pulmonary:     Effort: Pulmonary effort is normal.  Genitourinary:    Pubic Area: No rash.      Vagina: Vaginal discharge present. No erythema.     Cervix: Normal.  Skin:    General: Skin is dry.     Coloration: Skin is not pale.  Neurological:     Mental Status: She is alert and oriented to person, place, and time.  Psychiatric:        Behavior: Behavior normal.     No results found for any visits on 07/03/21.      Assessment & Plan:   Problem List Items Addressed This Visit   None Visit Diagnoses     Acute vaginitis    -  Primary   Relevant Orders   WET PREP FOR [place], YEAST, CLUE   SURESWAB CT/NG/T. vaginalis      Acute vaginitis-exam most consistent with yeast.  Wet prep sent.  Also test for GC chlamydia and trichomoniasis and will let her know results.  She wants to wait until her physical next month to get tested for HIV and syphilis.  No orders of the defined types were placed in this encounter.   No  follow-ups on file.  Beatrice Lecher, MD

## 2021-07-04 ENCOUNTER — Encounter: Payer: Self-pay | Admitting: Family Medicine

## 2021-07-04 MED ORDER — METRONIDAZOLE 500 MG PO TABS: 500.0000 mg | ORAL_TABLET | Freq: Three times a day (TID) | ORAL | 0 refills | Status: AC

## 2021-07-04 NOTE — Progress Notes (Signed)
Hi [name],  No sign of yeast actually on the wet prep but they did see some clue cells some get a send over prescription to clear that up.  Second swab is still pending.

## 2021-07-04 NOTE — Addendum Note (Signed)
Addended by: Beatrice Lecher D on: 07/04/2021 08:36 AM   Modules accepted: Orders

## 2021-07-05 ENCOUNTER — Other Ambulatory Visit: Payer: Self-pay | Admitting: Family Medicine

## 2021-07-05 DIAGNOSIS — I10 Essential (primary) hypertension: Secondary | ICD-10-CM

## 2021-07-05 LAB — SURESWAB CT/NG/T. VAGINALIS
C. trachomatis RNA, TMA: NOT DETECTED
N. gonorrhoeae RNA, TMA: NOT DETECTED
Trichomonas vaginalis RNA: NOT DETECTED

## 2021-07-07 NOTE — Progress Notes (Signed)
Negative for gonorrhea, chlamydia, trichomonas.

## 2021-08-08 ENCOUNTER — Other Ambulatory Visit: Payer: Self-pay | Admitting: Family Medicine

## 2021-08-08 ENCOUNTER — Other Ambulatory Visit: Payer: Self-pay

## 2021-08-08 DIAGNOSIS — I10 Essential (primary) hypertension: Secondary | ICD-10-CM

## 2021-08-08 DIAGNOSIS — G4700 Insomnia, unspecified: Secondary | ICD-10-CM

## 2021-08-08 MED ORDER — TRAZODONE HCL 50 MG PO TABS: 25.0000 mg | ORAL_TABLET | Freq: Every evening | ORAL | 3 refills | Status: DC | PRN

## 2021-08-08 MED ORDER — HYDROCHLOROTHIAZIDE 12.5 MG PO CAPS: 12.5000 mg | ORAL_CAPSULE | Freq: Every day | ORAL | 0 refills | Status: DC

## 2021-08-08 NOTE — Telephone Encounter (Signed)
Pt has been scheduled for 08/14/21, virtually, okay'd by Panya.

## 2021-08-14 ENCOUNTER — Telehealth (INDEPENDENT_AMBULATORY_CARE_PROVIDER_SITE_OTHER): Payer: No Typology Code available for payment source | Admitting: Family Medicine

## 2021-08-14 VITALS — BP 117/69 | Ht 62.0 in | Wt 209.0 lb

## 2021-08-14 DIAGNOSIS — R7301 Impaired fasting glucose: Secondary | ICD-10-CM

## 2021-08-14 DIAGNOSIS — I10 Essential (primary) hypertension: Secondary | ICD-10-CM | POA: Diagnosis not present

## 2021-08-14 DIAGNOSIS — Z1159 Encounter for screening for other viral diseases: Secondary | ICD-10-CM

## 2021-08-14 MED ORDER — HYDROCHLOROTHIAZIDE 12.5 MG PO CAPS
12.5000 mg | ORAL_CAPSULE | Freq: Every day | ORAL | 2 refills | Status: DC
Start: 2021-08-14 — End: 2022-12-25

## 2021-08-14 MED ORDER — AMLODIPINE BESYLATE 10 MG PO TABS: 10.0000 mg | ORAL_TABLET | Freq: Every day | ORAL | 2 refills | Status: DC

## 2021-08-14 NOTE — Assessment & Plan Note (Signed)
Home blood pressures have looked fantastic she is more recently started walking regularly which is great.  Just encouraged her to continue to work out and be consistent.  She is due for lipids so I will go ahead and put those orders in and encouraged her to come by the next month or 2 to have those drawn.  Function is up-to-date from May.

## 2021-08-14 NOTE — Progress Notes (Signed)
Needs refills on BP meds, no other issues

## 2021-08-14 NOTE — Progress Notes (Signed)
Virtual Visit via Video Note  I connected with [name] on 08/14/21 at  8:10 AM EDT by a video enabled telemedicine application and verified that I am speaking with the correct person using two identifiers.   I discussed the limitations of evaluation and management by telemedicine and the availability of in person appointments. The patient expressed understanding and agreed to proceed.  Patient location: at home Provider location: in office  Subjective:    CC:   Chief Complaint  Patient presents with   Hypertension    HPI:  Hypertension- Pt denies chest pain, SOB, dizziness, or heart palpitations.  Taking meds as directed w/o problems.  Denies medication side effects.   Check it every other week.  Gets dizzy if misses medication.  Otherwise has been tolerating medications well.  Walking some for exercise in the mornings when she gets off work..    Using trazodone at night as needed for sleep.  Will add to her medication list.    Past medical history, Surgical history, Family history not pertinant except as noted below, Social history, Allergies, and medications have been entered into the medical record, reviewed, and corrections made.    Objective:    General: Speaking clearly in complete sentences without any shortness of breath.  Alert and oriented x3.  Normal judgment. No apparent acute distress.    Impression and Recommendations:    Problem List Items Addressed This Visit       Cardiovascular and Mediastinum   Hypertension - Primary    Home blood pressures have looked fantastic she is more recently started walking regularly which is great.  Just encouraged her to continue to work out and be consistent.  She is due for lipids so I will go ahead and put those orders in and encouraged her to come by the next month or 2 to have those drawn.  Function is up-to-date from May.      Relevant Medications   hydrochlorothiazide (MICROZIDE) 12.5 MG capsule    amLODipine (NORVASC) 10 MG tablet     Endocrine   IFG (impaired fasting glucose)    Due to check A1c.      Relevant Orders   Hemoglobin A1c   Other Visit Diagnoses     Essential hypertension       Relevant Medications   hydrochlorothiazide (MICROZIDE) 12.5 MG capsule   amLODipine (NORVASC) 10 MG tablet   Other Relevant Orders   Lipid Panel w/reflex Direct LDL   COMPLETE METABOLIC PANEL WITH GFR   CBC   Encounter for hepatitis C screening test for low risk patient       Relevant Orders   Hepatitis C Antibody       Orders Placed This Encounter  Procedures   Lipid Panel w/reflex Direct LDL   COMPLETE METABOLIC PANEL WITH GFR   CBC   Hemoglobin A1c   Hepatitis C Antibody    Meds ordered this encounter  Medications   hydrochlorothiazide (MICROZIDE) 12.5 MG capsule    Sig: Take 1 capsule (12.5 mg total) by mouth daily.    Dispense:  90 capsule    Refill:  2   amLODipine (NORVASC) 10 MG tablet    Sig: Take 1 tablet (10 mg total) by mouth daily.    Dispense:  90 tablet    Refill:  2     I discussed the assessment and treatment plan with the patient. The patient was provided an opportunity to ask questions and all were  answered. The patient agreed with the plan and demonstrated an understanding of the instructions.   The patient was advised to call back or seek an in-person evaluation if the symptoms worsen or if the condition fails to improve as anticipated.   Beatrice Lecher, MD

## 2021-08-14 NOTE — Assessment & Plan Note (Signed)
Due to check A1c.

## 2022-01-08 ENCOUNTER — Encounter: Payer: Self-pay | Admitting: Family Medicine

## 2022-01-08 ENCOUNTER — Ambulatory Visit (INDEPENDENT_AMBULATORY_CARE_PROVIDER_SITE_OTHER): Payer: No Typology Code available for payment source | Admitting: Family Medicine

## 2022-01-08 VITALS — BP 130/82 | HR 101 | Ht 62.0 in | Wt 203.0 lb

## 2022-01-08 DIAGNOSIS — Z23 Encounter for immunization: Secondary | ICD-10-CM

## 2022-01-08 DIAGNOSIS — L0231 Cutaneous abscess of buttock: Secondary | ICD-10-CM | POA: Diagnosis not present

## 2022-01-08 MED ORDER — DOXYCYCLINE HYCLATE 100 MG PO TABS: 100.0000 mg | ORAL_TABLET | Freq: Two times a day (BID) | ORAL | 0 refills | Status: DC

## 2022-01-08 NOTE — Progress Notes (Signed)
Acute Office Visit  Subjective:     Patient ID: [name], female    DOB: 03/05/1980, 42 y.o.   MRN: [id number]  Chief Complaint  Patient presents with   Recurrent Skin Infections    HPI Patient is in today for a boil on her left buttocks.  She says she is never had this happen before but about 2 weeks ago she noticed a sore bump.  She is not sure what caused it but now it is getting worse more painful, it is difficult to sit and work.  Her partner did squeeze on it and got some discharge out of the area.  No fever or chills.  ROS      Objective:    BP 130/82   Pulse (!) 101   Ht '5\' 2"'$  (1.575 m)   Wt 203 lb (92.1 kg)   SpO2 96%   BMI 37.13 kg/m     Physical Exam Vitals reviewed.  Constitutional:      Appearance: She is well-developed.  HENT:     Head: Normocephalic and atraumatic.  Eyes:     Conjunctiva/sclera: Conjunctivae normal.  Skin:    General: Skin is dry.     Coloration: Skin is not pale.     Comments: The left buttock she has 2 papular areas 1 has a pustule in the center 1 is more of a scab.  The indurated area is approximately 4 x 5-1/2 cm extending out from the buttock area.  Neurological:     Mental Status: She is alert and oriented to person, place, and time.  Psychiatric:        Behavior: Behavior normal.     No results found for any visits on 01/08/22.      Assessment & Plan:   Problem List Items Addressed This Visit   None Visit Diagnoses     Left buttock abscess    -  Primary   Need for immunization against influenza       Relevant Orders   Flu Vaccine QUAD 78moIM (Fluarix, Fluzone & Alfiuria Quad PF) (Completed)      Left buttock abscess-discussed options incision and drainage is the best option at this point.  A large amount of pus was expressed from the incision.  She tolerated the procedure really well.  Follow wound care discussed.  Will also treat with doxycycline since it was such a deep infection with some tracking  laterally.  Incision and Drainage Procedure Note  Pre-operative Diagnosis: abscess   Post-operative Diagnosis: same  Indications: left buttick   Anesthesia: 1% lidocaine with epinephrine  Procedure Details  The procedure, risks and complications have been discussed in detail (including, but not limited to airway compromise, infection, bleeding) with the patient, and the patient has signed consent to the procedure.  The skin was sterilely prepped and draped over the affected area in the usual fashion.  Cold spray used to numb the surface of the skin before advancing the needle. After adequate local anesthesia, I&D with a #11 blade was performed on the left buttock. Purulent drainage: present.  It was probed with a sterile applicator.  It did track laterally.  The abscess was approximately 5 cm deep.  Quarter inch medicated packing placed into wound with a 2 cm tail left on the gauze.  Patient tolerated procedure well. The patient was observed until stable.  Findings: Abscess   EBL: trace  Drains: Quarter inch gauze packed into the open wound.  Condition: Tolerated procedure  well   Complications: none.    Meds ordered this encounter  Medications   doxycycline (VIBRA-TABS) 100 MG tablet    Sig: Take 1 tablet (100 mg total) by mouth 2 (two) times daily.    Dispense:  14 tablet    Refill:  0    No follow-ups on file.  Beatrice Lecher, MD

## 2022-01-12 ENCOUNTER — Encounter: Payer: Self-pay | Admitting: Family Medicine

## 2022-01-12 MED ORDER — FLUCONAZOLE 150 MG PO TABS: 150.0000 mg | ORAL_TABLET | Freq: Once | ORAL | 0 refills | Status: AC

## 2022-01-14 ENCOUNTER — Encounter: Payer: Self-pay | Admitting: Family Medicine

## 2022-01-14 ENCOUNTER — Ambulatory Visit: Payer: No Typology Code available for payment source | Admitting: Family Medicine

## 2022-01-14 VITALS — Temp 98.5°F

## 2022-01-14 DIAGNOSIS — L0231 Cutaneous abscess of buttock: Secondary | ICD-10-CM | POA: Diagnosis not present

## 2022-01-14 MED ORDER — SULFAMETHOXAZOLE-TRIMETHOPRIM 800-160 MG PO TABS: 2.0000 | ORAL_TABLET | Freq: Two times a day (BID) | ORAL | 0 refills | Status: DC

## 2022-01-14 MED ORDER — FLUCONAZOLE 150 MG PO TABS
150.0000 mg | ORAL_TABLET | Freq: Once | ORAL | 0 refills | Status: AC
Start: 2022-01-14 — End: 2022-01-14

## 2022-01-14 NOTE — Telephone Encounter (Signed)
[name] can you call pt and let her know I only put a string in there. No gauze.  I can work her in if needed.

## 2022-01-14 NOTE — Progress Notes (Signed)
   Acute Office Visit  Subjective:     Patient ID: [name], female    DOB: 1980-07-16, 42 y.o.   MRN: [id number]  Chief Complaint  Patient presents with   Wound Check    HPI Patient is in today for wound check.  She called today because was concerned the string cam out out but the area felt hard and worried there was gauze in the wound.  She is taking her doxy BID and has tolerated it well.    ROS      Objective:    Temp 98.5 F (36.9 C)     Physical Exam Skin:         Comments: In the medial area above the incision has mostly close but still had a small opening. Wound probed . No retained products . New 1/4 inch gauze placed.  There is a linear indurated area going laterally across the buttock with some erythema.       No results found for any visits on 01/14/22.      Assessment & Plan:   Problem List Items Addressed This Visit   None Visit Diagnoses     Abscess of right buttock    -  Primary   Relevant Orders   Wound culture   Left buttock abscess       Relevant Orders   Ambulatory referral to General Surgery       Changed abx to Bactrim DS instead of doxy.  Will place new referral to gen surgery for further treatment. I am very concerned about the extensive tracking of this abscess.    Incision and Drainage Procedure Note  Pre-operative Diagnosis: area lateral to original incision   Post-operative Diagnosis: abscess  Indications: induration and infection   Anesthesia: 1% plain lidocaine  Procedure Details  The procedure, risks and complications have been discussed in detail (including, but not limited to airway compromise, infection, bleeding) with the patient, and the patient has signed consent to the procedure.  The skin was sterilely prepped and draped over the affected area in the usual fashion. After adequate local anesthesia, I&D with a #11 blade was performed on the left lateral buttock . Copious Purulent drainage: present. Wounds  probed with sterile applicator.    The patient was observed until stable.  Findings: pus  EBL: trace  Drains: 2 placed   Condition: Tolerated procedure well   Complications: none.    Meds ordered this encounter  Medications   sulfamethoxazole-trimethoprim (BACTRIM DS) 800-160 MG tablet    Sig: Take 2 tablets by mouth 2 (two) times daily.    Dispense:  40 tablet    Refill:  0   fluconazole (DIFLUCAN) 150 MG tablet    Sig: Take 1 tablet (150 mg total) by mouth once for 1 dose.    Dispense:  2 tablet    Refill:  0    No follow-ups on file.  Beatrice Lecher, MD

## 2022-01-14 NOTE — Progress Notes (Signed)
Pt reports that she had been trimming the string as directed but today the whole string fell out but she noticed that there was gauze was still in.

## 2022-01-17 ENCOUNTER — Encounter: Payer: Self-pay | Admitting: Family Medicine

## 2022-01-17 LAB — WOUND CULTURE
MICRO NUMBER:: 14416673
SPECIMEN QUALITY:: ADEQUATE

## 2022-01-17 MED ORDER — CLINDAMYCIN HCL 300 MG PO CAPS: 300.0000 mg | ORAL_CAPSULE | Freq: Three times a day (TID) | ORAL | 0 refills | Status: AC

## 2022-01-17 NOTE — Progress Notes (Signed)
HI [name], I got your culture back. We need to change antibiotics again.  I will send over clindamycin. You can stop the Bactrim DS. I sent it to the [facility] on [address].

## 2022-01-17 NOTE — Addendum Note (Signed)
Addended by: Beatrice Lecher D on: 01/17/2022 04:47 PM   Modules accepted: Orders

## 2022-06-01 ENCOUNTER — Other Ambulatory Visit: Payer: Self-pay | Admitting: Obstetrics & Gynecology

## 2022-06-16 IMAGING — DX DG CHEST 2V
2 series · 2 of 2 positions shown · non-contrast
Comparison: Chest radiograph 01/22/2012

CLINICAL DATA: Shortness of breath and chest pain

EXAM:
CHEST - 2 VIEW

[chest pa]
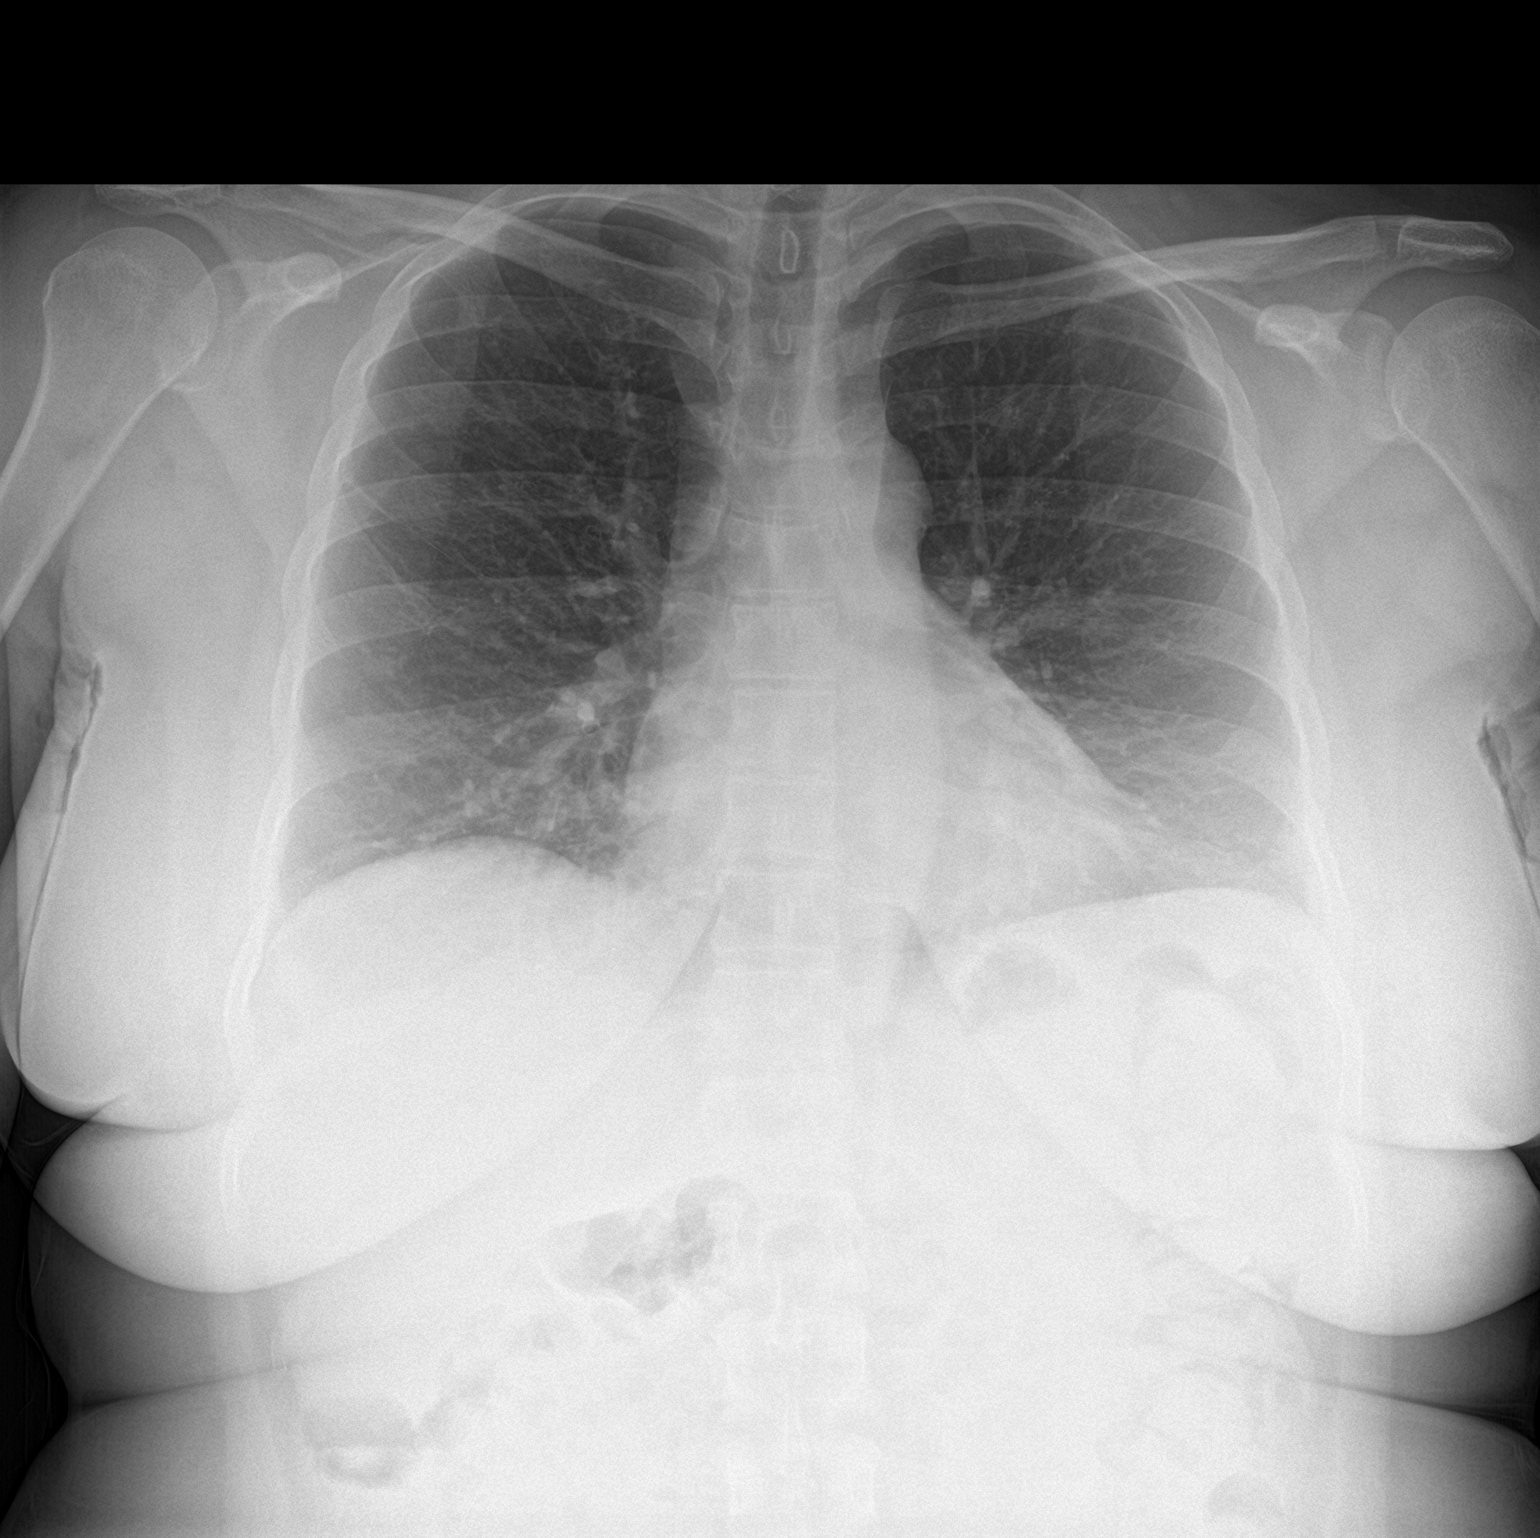

[chest lat]
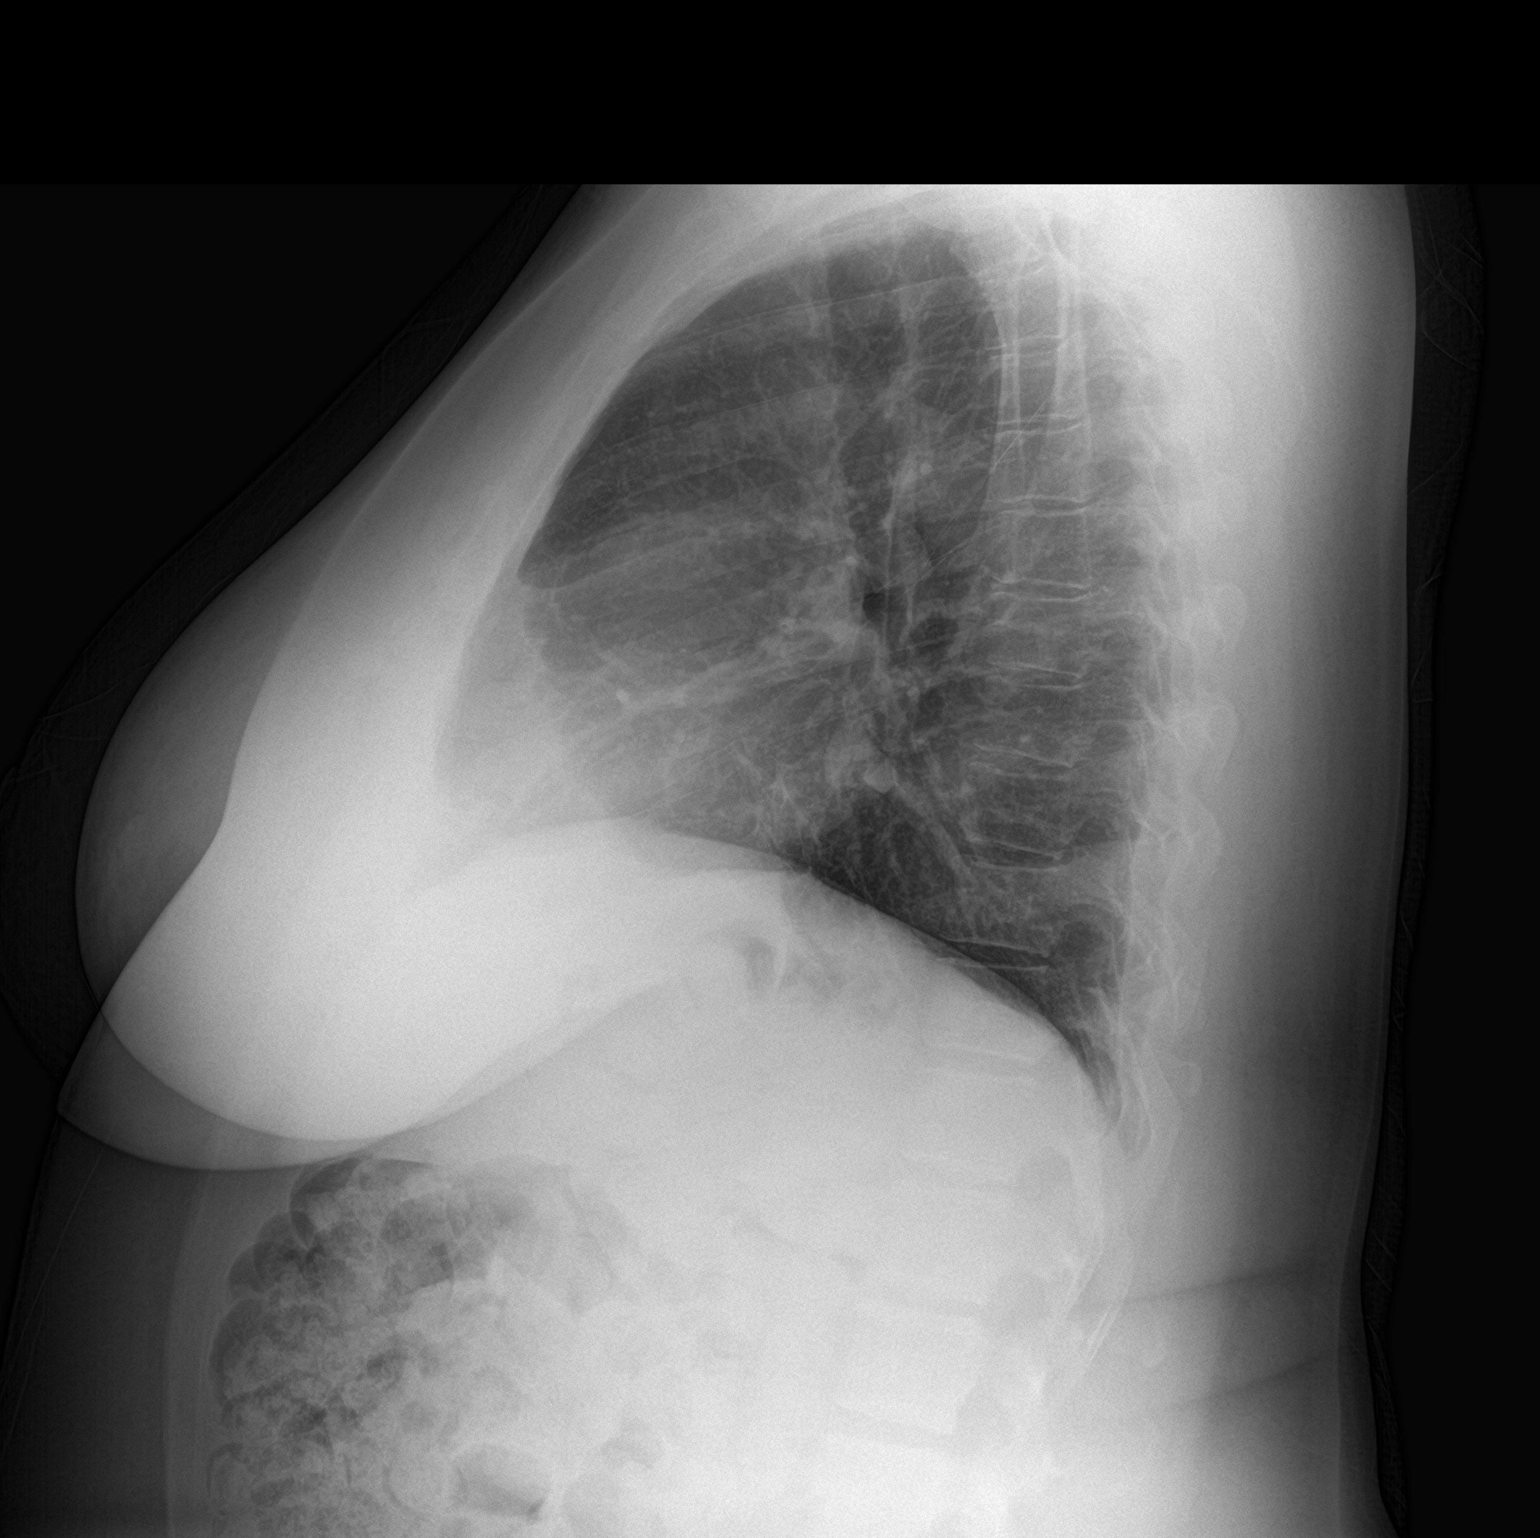

[2 of 2 positions shown; findings below may reference images not displayed]

FINDINGS: The heart size and mediastinal contours are within normal limits.
Both lungs are clear. The visualized skeletal structures are
unremarkable.
IMPRESSION: No active cardiopulmonary disease.

## 2022-06-22 ENCOUNTER — Ambulatory Visit: Payer: No Typology Code available for payment source

## 2022-06-22 ENCOUNTER — Other Ambulatory Visit: Payer: Self-pay | Admitting: *Deleted

## 2022-06-22 MED ORDER — MEDROXYPROGESTERONE ACETATE 150 MG/ML IM SUSP: 150.0000 mg | INTRAMUSCULAR | 0 refills | Status: DC

## 2022-06-22 NOTE — Telephone Encounter (Cosign Needed)
Pt called requesting a RF on Depo Provera.  1 RF sent to pharmacy and she is aware that she needs her annual scheduled.  Last annual 5/23.

## 2022-06-23 ENCOUNTER — Ambulatory Visit (INDEPENDENT_AMBULATORY_CARE_PROVIDER_SITE_OTHER): Payer: Self-pay | Admitting: *Deleted

## 2022-06-23 DIAGNOSIS — Z3042 Encounter for surveillance of injectable contraceptive: Secondary | ICD-10-CM

## 2022-06-23 MED ORDER — MEDROXYPROGESTERONE ACETATE 150 MG/ML IM SUSP
150.0000 mg | Freq: Once | INTRAMUSCULAR | Status: AC
  Administered 2022-06-23: 150 mg via INTRAMUSCULAR

## 2022-06-23 NOTE — Progress Notes (Signed)
Pt here  for Depo Provera injection only.  Last Depo injection 3 months ago at work

## 2022-08-24 ENCOUNTER — Telehealth: Payer: Self-pay | Admitting: Family Medicine

## 2022-08-24 ENCOUNTER — Other Ambulatory Visit: Payer: Self-pay | Admitting: Family Medicine

## 2022-08-24 DIAGNOSIS — I10 Essential (primary) hypertension: Secondary | ICD-10-CM

## 2022-08-24 NOTE — Telephone Encounter (Signed)
Patient called in for med refill on amLODipine (NORVASC) 10 MG tablet.  [facility] [address] [place] [id number]

## 2022-08-25 NOTE — Telephone Encounter (Signed)
Pt called.  She is following up on refill on her amLODipine.

## 2022-08-25 NOTE — Telephone Encounter (Signed)
30 day sent. She is overdue for f/u for bp and labs.

## 2022-09-15 ENCOUNTER — Telehealth: Payer: Self-pay | Admitting: *Deleted

## 2022-09-15 ENCOUNTER — Ambulatory Visit: Payer: Self-pay

## 2022-09-15 NOTE — Telephone Encounter (Signed)
Left patient a message to call the office to reschedule her Depo appointment.

## 2022-09-16 ENCOUNTER — Other Ambulatory Visit: Payer: Self-pay | Admitting: Family Medicine

## 2022-09-16 DIAGNOSIS — I10 Essential (primary) hypertension: Secondary | ICD-10-CM

## 2022-09-18 ENCOUNTER — Other Ambulatory Visit: Payer: Self-pay | Admitting: Obstetrics & Gynecology

## 2022-09-18 NOTE — Telephone Encounter (Signed)
Hold for 9/19 appt with Dr. Linford Arnold. Current rx refill lasts until 9/20

## 2022-09-20 ENCOUNTER — Other Ambulatory Visit: Payer: Self-pay | Admitting: Family Medicine

## 2022-09-20 DIAGNOSIS — I10 Essential (primary) hypertension: Secondary | ICD-10-CM

## 2022-09-23 DIAGNOSIS — Z111 Encounter for screening for respiratory tuberculosis: Secondary | ICD-10-CM | POA: Insufficient documentation

## 2022-09-24 ENCOUNTER — Ambulatory Visit: Payer: Self-pay | Admitting: Family Medicine

## 2022-09-24 DIAGNOSIS — R7301 Impaired fasting glucose: Secondary | ICD-10-CM

## 2022-09-24 DIAGNOSIS — I10 Essential (primary) hypertension: Secondary | ICD-10-CM

## 2022-09-24 DIAGNOSIS — Z1159 Encounter for screening for other viral diseases: Secondary | ICD-10-CM

## 2022-09-29 ENCOUNTER — Encounter: Payer: Self-pay | Admitting: Family Medicine

## 2022-09-30 NOTE — Telephone Encounter (Signed)
There are several more pages to this form I have to be able to mark boxing I reviewed the questionnaire in regards to the respirator and none of those are attached.  I could probably be able to sign off on it but she would need to complete all of those forms and questions and drop it off.

## 2022-09-30 NOTE — Telephone Encounter (Signed)
?    There is more than one page. She may want to talk with her employer

## 2022-10-01 NOTE — Telephone Encounter (Signed)
Spoke with patient she is going to print and bring the copies to the office for [place] to complete.

## 2022-10-01 NOTE — Telephone Encounter (Signed)
Patient called [name] back. She states that her employer told her that the form is just the one sheet that she had attached to the message.  She is only needing this sheet completed per patient.

## 2022-10-02 ENCOUNTER — Encounter: Payer: Self-pay | Admitting: Family Medicine

## 2022-10-02 ENCOUNTER — Telehealth: Payer: Self-pay | Admitting: Family Medicine

## 2022-10-02 NOTE — Telephone Encounter (Signed)
Patient called in wanting an update on paperwork for work

## 2022-10-05 ENCOUNTER — Encounter: Payer: Self-pay | Admitting: Family Medicine

## 2022-10-05 ENCOUNTER — Other Ambulatory Visit: Payer: Self-pay | Admitting: Family Medicine

## 2022-10-05 DIAGNOSIS — I10 Essential (primary) hypertension: Secondary | ICD-10-CM

## 2022-10-05 NOTE — Telephone Encounter (Signed)
Form completed based on BP since questionnaire not attached.  Given to the patient.

## 2022-10-06 NOTE — Telephone Encounter (Signed)
Synetta Fail informed me that patient came today to pick up paperwork

## 2022-10-07 NOTE — Telephone Encounter (Signed)
Pt's forms were completed and she picked these up.

## 2022-10-07 NOTE — Telephone Encounter (Signed)
This has been addressed.

## 2022-10-22 ENCOUNTER — Other Ambulatory Visit: Payer: Self-pay | Admitting: Family Medicine

## 2022-10-22 DIAGNOSIS — I10 Essential (primary) hypertension: Secondary | ICD-10-CM

## 2022-10-22 NOTE — Telephone Encounter (Signed)
I called patient and she is scheduled for November 7th for her fasting labs and BP check

## 2022-10-22 NOTE — Telephone Encounter (Signed)
Please call pt and have her schedule an appt for bp and  fasting labs. She will not be able to continue to get her medication if she cannot comply. Thanks.

## 2022-11-05 ENCOUNTER — Other Ambulatory Visit: Payer: Self-pay | Admitting: Family Medicine

## 2022-11-05 DIAGNOSIS — I10 Essential (primary) hypertension: Secondary | ICD-10-CM

## 2022-11-12 ENCOUNTER — Ambulatory Visit: Payer: Self-pay | Admitting: Family Medicine

## 2022-12-02 ENCOUNTER — Encounter: Payer: Self-pay | Admitting: Family Medicine

## 2022-12-02 ENCOUNTER — Ambulatory Visit (INDEPENDENT_AMBULATORY_CARE_PROVIDER_SITE_OTHER): Payer: PRIVATE HEALTH INSURANCE | Admitting: Family Medicine

## 2022-12-02 VITALS — BP 128/80 | HR 88 | Resp 14 | Ht 62.0 in | Wt 216.1 lb

## 2022-12-02 DIAGNOSIS — Z23 Encounter for immunization: Secondary | ICD-10-CM | POA: Diagnosis not present

## 2022-12-02 DIAGNOSIS — R7301 Impaired fasting glucose: Secondary | ICD-10-CM | POA: Diagnosis not present

## 2022-12-02 DIAGNOSIS — I10 Essential (primary) hypertension: Secondary | ICD-10-CM | POA: Diagnosis not present

## 2022-12-02 DIAGNOSIS — G4700 Insomnia, unspecified: Secondary | ICD-10-CM | POA: Insufficient documentation

## 2022-12-02 MED ORDER — TRAZODONE HCL 50 MG PO TABS: 25.0000 mg | ORAL_TABLET | Freq: Every evening | ORAL | 3 refills | Status: DC | PRN

## 2022-12-02 NOTE — Assessment & Plan Note (Signed)
Will go ahead and refill trazodone today she just uses it as needed particularly on days when she is not working so that she can get some rest.

## 2022-12-02 NOTE — Assessment & Plan Note (Signed)
Due to check A1C.  Will call with results.

## 2022-12-02 NOTE — Assessment & Plan Note (Signed)
Well controlled. Continue current regimen. Follow up in  6 mo  

## 2022-12-02 NOTE — Progress Notes (Signed)
Established Patient Office Visit  Subjective   Patient ID: [name], female    DOB: [birth year]  Age: 42 y.o. MRN: [id number]  Chief Complaint  Patient presents with   Hypertension    HPI  Hypertension- Pt denies chest pain, SOB, dizziness, or heart palpitations.  Taking meds as directed w/o problems.  Denies medication side effects.    Impaired fasting glucose-no increased thirst or urination. No symptoms consistent with hypoglycemia.  Sleep has not been great but she did run out of the trazodone and she would like a refill.  Have some questions about her last Pap smear.     ROS    Objective:     BP 128/80   Pulse 88   Resp 14   Ht 5\' 2"  (1.575 m)   Wt 216 lb 1.9 oz (98 kg)   SpO2 97%   BMI 39.53 kg/m    Physical Exam Vitals and nursing note reviewed.  Constitutional:      Appearance: Normal appearance.  HENT:     Head: Normocephalic and atraumatic.  Eyes:     Conjunctiva/sclera: Conjunctivae normal.  Cardiovascular:     Rate and Rhythm: Normal rate and regular rhythm.  Pulmonary:     Effort: Pulmonary effort is normal.     Breath sounds: Normal breath sounds.  Skin:    General: Skin is warm and dry.  Neurological:     Mental Status: She is alert.  Psychiatric:        Mood and Affect: Mood normal.      No results found for any visits on 12/02/22.    The 10-year ASCVD risk score (Arnett DK, et al., 2019) is: 1.4%    Assessment & Plan:   Problem List Items Addressed This Visit       Cardiovascular and Mediastinum   Hypertension    Well controlled. Continue current regimen. Follow up in  6 mo       Relevant Orders   CMP14+EGFR   Lipid panel   CBC   Hemoglobin A1c   Hepatitis C Antibody     Endocrine   IFG (impaired fasting glucose) - Primary    Due to check A1C.  Will call with results.       Relevant Orders   CMP14+EGFR   Lipid panel   CBC   Hemoglobin A1c   Hepatitis C Antibody     Other   Insomnia    Will go  ahead and refill trazodone today she just uses it as needed particularly on days when she is not working so that she can get some rest.      Relevant Medications   traZODone (DESYREL) 50 MG tablet   Other Visit Diagnoses     Encounter for immunization       Relevant Orders   Flu vaccine trivalent PF, 6mos and older(Flulaval,Afluria,Fluarix,Fluzone) (Completed)       He is due for colposcopy.  She wanted to know if she could just get her Pap smear done here but when I look back her Pap from May 2023 it showed some low-grade cellular changes and they had recommended colposcopy.  Highly recommend her get back in with OB/GYN to have this done we had a conversation today about those abnormal cells and that they need further workup.  Encouraged her to schedule her mammogram as well.  She had the previous one done in [place] but we do not have 1 in the last several years on  file for her.  Return in about 6 months (around 06/01/2023) for Hypertension.    Nani Gasser, MD

## 2022-12-03 LAB — CMP14+EGFR
ALT: 18 [IU]/L (ref 0–32)
AST: 17 [IU]/L (ref 0–40)
Albumin: 4.6 g/dL (ref 3.9–4.9)
Alkaline Phosphatase: 101 [IU]/L (ref 44–121)
BUN/Creatinine Ratio: 12 (ref 9–23)
BUN: 12 mg/dL (ref 6–24)
Bilirubin Total: <0.2 mg/dL (ref 0.0–1.2)
CO2: 22 mmol/L (ref 20–29)
Calcium: 9.8 mg/dL (ref 8.7–10.2)
Chloride: 105 mmol/L (ref 96–106)
Creatinine, Ser: 1.03 mg/dL — ABNORMAL HIGH (ref 0.57–1.00)
Globulin, Total: 3 g/dL (ref 1.5–4.5)
Glucose: 116 mg/dL — ABNORMAL HIGH (ref 70–99)
Potassium: 3.8 mmol/L (ref 3.5–5.2)
Sodium: 143 mmol/L (ref 134–144)
Total Protein: 7.6 g/dL (ref 6.0–8.5)
eGFR: 69 mL/min/{1.73_m2} (ref >59)

## 2022-12-03 LAB — CBC
Hematocrit: 42.2 % (ref 34.0–46.6)
Hemoglobin: 13.5 g/dL (ref 11.1–15.9)
MCH: 30.1 pg (ref 26.6–33.0)
MCHC: 32 g/dL (ref 31.5–35.7)
MCV: 94 fL (ref 79–97)
Platelets: 411 10*3/uL (ref 150–450)
RBC: 4.49 x10E6/uL (ref 3.77–5.28)
RDW: 14.1 % (ref 11.7–15.4)
WBC: 10 10*3/uL (ref 3.4–10.8)

## 2022-12-03 LAB — HEPATITIS C ANTIBODY: Hep C Virus Ab: NONREACTIVE

## 2022-12-03 LAB — LIPID PANEL
Chol/HDL Ratio: 2.5 {ratio} (ref 0.0–4.4)
Cholesterol, Total: 164 mg/dL (ref 100–199)
HDL: 66 mg/dL (ref >39)
LDL Chol Calc (NIH): 70 mg/dL (ref 0–99)
Triglycerides: 169 mg/dL — ABNORMAL HIGH (ref 0–149)
VLDL Cholesterol Cal: 28 mg/dL (ref 5–40)

## 2022-12-03 LAB — HEMOGLOBIN A1C
Est. average glucose Bld gHb Est-mCnc: 140 mg/dL
Hgb A1c MFr Bld: 6.5 % — ABNORMAL HIGH (ref 4.8–5.6)

## 2022-12-07 NOTE — Progress Notes (Signed)
Hi [name], kidney function is up a little bit from your baseline normally around 0.7.  It was 1.0 this time so I definitely want to check that again in about 2 to 3 weeks.  Liver function was normal.  Total cholesterol and LDL look great.  Triglycerides were up a little bit.  Just continue to work on healthy diet and regular exercise.  Also your A1c was previously in the prediabetes range it is technically 6.5 which by definition is diabetes.  Really want you to work on cutting back on sugars and carbs and lets plan to recheck the A1c in 90 days.  Blood count is normal.  Negative for hepatitis C.

## 2022-12-14 ENCOUNTER — Other Ambulatory Visit: Payer: Self-pay | Admitting: *Deleted

## 2022-12-14 ENCOUNTER — Ambulatory Visit: Payer: PRIVATE HEALTH INSURANCE | Admitting: Obstetrics & Gynecology

## 2022-12-14 ENCOUNTER — Other Ambulatory Visit (HOSPITAL_COMMUNITY)
Admission: RE | Admit: 2022-12-14 | Discharge: 2022-12-14 | Disposition: A | Discharge status: Home / Self Care | Payer: PRIVATE HEALTH INSURANCE | Source: Ambulatory Visit | Attending: Obstetrics & Gynecology | Admitting: Obstetrics & Gynecology

## 2022-12-14 ENCOUNTER — Encounter: Payer: Self-pay | Admitting: Obstetrics & Gynecology

## 2022-12-14 VITALS — BP 128/86 | HR 80 | Ht 62.0 in | Wt 219.0 lb

## 2022-12-14 DIAGNOSIS — Z01419 Encounter for gynecological examination (general) (routine) without abnormal findings: Secondary | ICD-10-CM | POA: Insufficient documentation

## 2022-12-14 DIAGNOSIS — Z1151 Encounter for screening for human papillomavirus (HPV): Secondary | ICD-10-CM

## 2022-12-14 MED ORDER — MEDROXYPROGESTERONE ACETATE 150 MG/ML IM SUSP: 150.0000 mg | INTRAMUSCULAR | 4 refills | Status: AC

## 2022-12-14 NOTE — Progress Notes (Signed)
  Subjective:     [name] is a 42 y.o. female here for a routine exam.  Current complaints: none.    Gynecologic History No LMP recorded. Patient has had an injection. Contraception: Depo-Provera injections Last Mammogram: unknown Last Pap Smear:  05/12/21- abnormal Last Colon Screening;  n/a Seat Belts:   yes Sun Screen:   yes Dental Check Up:  yes Brush & Floss:  yes   Obstetric History OB History  Gravida Para Term Preterm AB Living  2 2 2     1   SAB IAB Ectopic Multiple Live Births          1    # Outcome Date GA Lbr Len/2nd Weight Sex Type Anes PTL Lv  2 Term 08/09/98   6 lb 3 oz (2.807 kg)  CS-LTranv  N LIV     Complications: Dysfunctional Labor  1 Term              The following portions of the patient's history were reviewed and updated as appropriate: allergies, current medications, past family history, past medical history, past social history, past surgical history, and problem list.  Review of Systems Pertinent items noted in HPI and remainder of comprehensive ROS otherwise negative.    Objective:     Vitals:   12/14/22 0959  BP: 128/86  Pulse: 80  Weight: 219 lb (99.3 kg)  Height: 5\' 2"  (1.575 m)   Vitals:  WNL General appearance: alert, cooperative and no distress  HEENT: Normocephalic, without obvious abnormality, atraumatic Eyes: negative Throat: lips, mucosa, and tongue normal; teeth and gums normal  Respiratory: Clear to auscultation bilaterally  CV: Regular rate and rhythm  Breasts:  Normal appearance, no tenderness, no nipple retraction or dimpling; mass on right breast 9 o'clock near nipple.   GI: Soft, non-tender; bowel sounds normal; no masses,  no organomegaly  GU: External Genitalia:  Tanner V, no lesion Urethra:  No prolapse   Vagina: Pink, normal rugae, no blood or discharge  Cervix: No CMT, no lesion  Uterus:  Normal size and contour, non tender; exam limited by habitus  Adnexa: Normal, no masses, non tender, exam limited by  habitus  Musculoskeletal: No edema, redness or tenderness in the calves or thighs  Skin: No lesions or rash  Lymphatic: Axillary adenopathy: none     Psychiatric: Normal mood and behavior        Assessment:    Healthy female exam.    Plan:   Pap with cotesting--Pt did not come for colpo last year; pt understands the importance of getting the follow up if abnml pap smear today.  Risk of cancer reviewed.  Right diagnostic mammogram and [place] of mass on right breast 9 o'clock near nipple.  Colon cancer screening age 33. Depo q 3 months.

## 2022-12-15 MED ORDER — MEDROXYPROGESTERONE ACETATE 150 MG/ML IM SUSP: 150.0000 mg | INTRAMUSCULAR | 2 refills | Status: DC

## 2022-12-16 LAB — CYTOLOGY - PAP
Comment: NEGATIVE
Diagnosis: NEGATIVE
High risk HPV: NEGATIVE

## 2022-12-18 ENCOUNTER — Other Ambulatory Visit: Payer: Self-pay | Admitting: Family Medicine

## 2022-12-18 DIAGNOSIS — I10 Essential (primary) hypertension: Secondary | ICD-10-CM

## 2023-01-09 ENCOUNTER — Other Ambulatory Visit: Payer: Self-pay | Admitting: Family Medicine

## 2023-01-09 DIAGNOSIS — I10 Essential (primary) hypertension: Secondary | ICD-10-CM

## 2023-01-12 ENCOUNTER — Encounter: Payer: PRIVATE HEALTH INSURANCE | Admitting: Family Medicine

## 2023-04-01 ENCOUNTER — Other Ambulatory Visit: Payer: Self-pay | Admitting: Family Medicine

## 2023-04-01 DIAGNOSIS — I10 Essential (primary) hypertension: Secondary | ICD-10-CM

## 2023-05-11 ENCOUNTER — Other Ambulatory Visit: Payer: Self-pay | Admitting: Family Medicine

## 2023-05-11 DIAGNOSIS — I10 Essential (primary) hypertension: Secondary | ICD-10-CM

## 2023-07-11 ENCOUNTER — Other Ambulatory Visit: Payer: Self-pay | Admitting: Family Medicine

## 2023-07-11 DIAGNOSIS — I10 Essential (primary) hypertension: Secondary | ICD-10-CM

## 2023-07-12 ENCOUNTER — Encounter: Payer: Self-pay | Admitting: Family Medicine

## 2023-07-12 ENCOUNTER — Other Ambulatory Visit: Payer: Self-pay | Admitting: Family Medicine

## 2023-07-12 DIAGNOSIS — I10 Essential (primary) hypertension: Secondary | ICD-10-CM

## 2023-07-12 MED ORDER — HYDROCHLOROTHIAZIDE 12.5 MG PO CAPS: 12.5000 mg | ORAL_CAPSULE | Freq: Every day | ORAL | 0 refills | Status: DC

## 2023-07-12 MED ORDER — AMLODIPINE BESYLATE 10 MG PO TABS: 10.0000 mg | ORAL_TABLET | Freq: Every day | ORAL | 0 refills | Status: DC

## 2023-07-12 NOTE — Telephone Encounter (Signed)
 Appointment for bp needed for refills. Please call pt. Her last OV was 12/02/2022

## 2023-07-12 NOTE — Telephone Encounter (Signed)
 Pt was to f/u in 6 months. Her last OV was 12/02/2022 she will need an appointment for refills on bp medications. Please call her to schedule.

## 2023-07-12 NOTE — Telephone Encounter (Unsigned)
 Copied from CRM [id number]. Topic: Clinical - Medication Refill >> Jul 12, 2023  9:28 AM Zane F wrote: Patient called in after going to the pharmacy and being told she has no more of the following prescriptions. Patient is completely out of the amlodipine .   Medication:   1. amLODipine  (NORVASC ) 10 MG tablet  2. hydrochlorothiazide  (MICROZIDE ) 12.5 MG capsule  Has the patient contacted their pharmacy? Yes   This is the patient's preferred pharmacy:  [id number] - [place], [place] - [address] AT [facility] [address] [place] [address] Phone: [phone] Fax: [phone]    Is this the correct pharmacy for this prescription? Yes   Has the prescription been filled recently? No  Is the patient out of the medication? Yes  Has the patient been seen for an appointment in the last year OR does the patient have an upcoming appointment? Yes  Can we respond through MyChart? Yes  Agent: Please be advised that Rx refills may take up to 3 business days. We ask that you follow-up with your pharmacy.

## 2023-07-13 NOTE — Telephone Encounter (Signed)
 Called spoke with patient she is scheduled for a 6 month follow up

## 2023-07-13 NOTE — Telephone Encounter (Signed)
 Thanks

## 2023-07-26 ENCOUNTER — Ambulatory Visit: Payer: PRIVATE HEALTH INSURANCE | Admitting: Family Medicine

## 2023-07-26 DIAGNOSIS — I10 Essential (primary) hypertension: Secondary | ICD-10-CM

## 2023-07-26 DIAGNOSIS — R7301 Impaired fasting glucose: Secondary | ICD-10-CM

## 2023-08-08 ENCOUNTER — Other Ambulatory Visit: Payer: Self-pay | Admitting: Family Medicine

## 2023-08-08 DIAGNOSIS — I10 Essential (primary) hypertension: Secondary | ICD-10-CM

## 2023-08-09 ENCOUNTER — Other Ambulatory Visit: Payer: Self-pay | Admitting: Family Medicine

## 2023-08-09 DIAGNOSIS — I10 Essential (primary) hypertension: Secondary | ICD-10-CM

## 2023-08-11 ENCOUNTER — Ambulatory Visit: Payer: PRIVATE HEALTH INSURANCE | Admitting: Family Medicine

## 2023-08-12 ENCOUNTER — Ambulatory Visit (INDEPENDENT_AMBULATORY_CARE_PROVIDER_SITE_OTHER): Payer: PRIVATE HEALTH INSURANCE | Admitting: Podiatry

## 2023-08-12 ENCOUNTER — Encounter: Payer: Self-pay | Admitting: Podiatry

## 2023-08-12 DIAGNOSIS — B353 Tinea pedis: Secondary | ICD-10-CM | POA: Diagnosis not present

## 2023-08-12 MED ORDER — METHYLPREDNISOLONE 4 MG PO TBPK
ORAL_TABLET | ORAL | 0 refills | Status: DC
Start: 2023-08-12 — End: 2023-09-14

## 2023-08-12 MED ORDER — TERBINAFINE HCL 250 MG PO TABS: 250.0000 mg | ORAL_TABLET | Freq: Every day | ORAL | 0 refills | Status: AC

## 2023-08-12 NOTE — Progress Notes (Signed)
 Subjective:  Patient ID: [name], female    DOB: 22-Oct-1980,  MRN: [id number] HPI Chief Complaint  Patient presents with   Skin Problem    Dorsal forefoot/toes/interdigital right - scaly, itching x few months, same issue years ago with Dr. Ziglar and he Rx'd cream that helped   New Patient (Initial Visit)    43 y.o. female presents with the above complaint.   ROS: Denies fever chills nausea muscle aches pains calf pain back pain chest pain shortness of breath.  Past Medical History:  Diagnosis Date   GERD (gastroesophageal reflux disease)    Headache    Herpes simplex without mention of complication    daily acyclovir    Vaginal yeast infection    recurrent   Past Surgical History:  Procedure Laterality Date   BREAST SURGERY Bilateral    benign cysts   CESAREAN SECTION     x 1   HYSTEROSCOPY N/A 07/02/2017   Procedure: OPERATIVE HYSTEROSCOPY;  Surgeon: Cris Burnard DEL, MD;  Location: [facility];  Service: Gynecology;  Laterality: N/A;   HYSTEROSCOPY WITH D & C N/A 07/02/2017   Procedure: DILATATION AND CURETTAGE /HYSTEROSCOPY;  Surgeon: Cris Burnard DEL, MD;  Location: [facility];  Service: Gynecology;  Laterality: N/A;   IUD REMOVAL N/A 07/02/2017   Procedure: INTRAUTERINE DEVICE (IUD) REMOVAL;  Surgeon: Cris Burnard DEL, MD;  Location: [facility];  Service: Gynecology;  Laterality: N/A;   WISDOM TOOTH EXTRACTION      Current Outpatient Medications:    methylPREDNISolone  (MEDROL  DOSEPAK) 4 MG TBPK tablet, 6 day dose pack - take as directed, Disp: 21 tablet, Rfl: 0   terbinafine  (LAMISIL ) 250 MG tablet, Take 1 tablet (250 mg total) by mouth daily., Disp: 30 tablet, Rfl: 0   amLODipine  (NORVASC ) 10 MG tablet, TAKE 1 TABLET (10 MG TOTAL) BY MOUTH DAILY. NEEDS APPOINTMENT., Disp: 30 tablet, Rfl: 0   aspirin-acetaminophen -caffeine (EXCEDRIN MIGRAINE) 250-250-65 MG tablet, Take 2 tablets by mouth every 6 (six) hours as needed for headache., Disp: , Rfl:    hydrochlorothiazide   (MICROZIDE ) 12.5 MG capsule, Take 1 capsule (12.5 mg total) by mouth daily. Needs appointment., Disp: 30 capsule, Rfl: 0   medroxyPROGESTERone  (DEPO-PROVERA ) 150 MG/ML injection, Inject 1 mL (150 mg total) into the muscle every 3 (three) months., Disp: 1 mL, Rfl: 2   medroxyPROGESTERone  (DEPO-PROVERA ) 150 MG/ML injection, Inject 1 mL (150 mg total) into the muscle every 3 (three) months., Disp: 1 mL, Rfl: 4   traZODone  (DESYREL ) 50 MG tablet, Take 0.5-1 tablets (25-50 mg total) by mouth at bedtime as needed for sleep., Disp: 30 tablet, Rfl: 3  Allergies  Allergen Reactions   Latex Rash   Amoxicillin  Other (See Comments)    REACTION: Pt states she always gets yeast infection. Denies history of allergic rash or urticaria or any generalized reaction    Linzess [Linaclotide] Nausea And Vomiting    On the dose.  Lower dose ineffective.     Losartan  Other (See Comments)    Lightheadedness and dizziness   Maxzide [Triamterene -Hctz] Other (See Comments)    dizziness   Penicillins    Review of Systems Objective:  There were no vitals filed for this visit.  General: Well developed, nourished, in no acute distress, alert and oriented x3   Dermatological: Skin is warm, dry and supple bilateral. Nails x 10 are well maintained; remaining integument appears unremarkable at this time. There are no open sores, no preulcerative lesions.  No rashes to the left foot  however the right foot does demonstrate what appears to be moderate tinea pedis interdigital and around the toes.   Vascular: Dorsalis Pedis artery and Posterior Tibial artery pedal pulses are 2/4 bilateral with immedate capillary fill time. Pedal hair growth present. No varicosities and no lower extremity edema present bilateral.   Neruologic: Grossly intact via light touch bilateral. Vibratory intact via tuning fork bilateral. Protective threshold with Semmes Wienstein monofilament intact to all pedal sites bilateral. Patellar and  Achilles deep tendon reflexes 2+ bilateral. No Babinski or clonus noted bilateral.   Musculoskeletal: No gross boney pedal deformities bilateral. No pain, crepitus, or limitation noted with foot and ankle range of motion bilateral. Muscular strength 5/5 in all groups tested bilateral.  Gait: Unassisted, Nonantalgic.    Radiographs:  None taken  Assessment & Plan:   Assessment: Tinea pedis right foot  Plan: Methylprednisolone  dispensed as well as Lamisil  250 mg tablets 1 p.o. daily for 30 days.  Follow-up with her in about 6 weeks or so.     Stanlee Roehrig T. [place], [place]

## 2023-08-19 ENCOUNTER — Ambulatory Visit: Payer: Self-pay | Admitting: Podiatry

## 2023-08-30 ENCOUNTER — Ambulatory Visit (INDEPENDENT_AMBULATORY_CARE_PROVIDER_SITE_OTHER): Payer: PRIVATE HEALTH INSURANCE | Admitting: Family Medicine

## 2023-08-30 ENCOUNTER — Ambulatory Visit (INDEPENDENT_AMBULATORY_CARE_PROVIDER_SITE_OTHER): Payer: PRIVATE HEALTH INSURANCE

## 2023-08-30 ENCOUNTER — Encounter: Payer: Self-pay | Admitting: Family Medicine

## 2023-08-30 VITALS — BP 123/81 | HR 93 | Ht 62.0 in | Wt 205.6 lb

## 2023-08-30 DIAGNOSIS — M25562 Pain in left knee: Secondary | ICD-10-CM | POA: Diagnosis not present

## 2023-08-30 DIAGNOSIS — N898 Other specified noninflammatory disorders of vagina: Secondary | ICD-10-CM | POA: Diagnosis not present

## 2023-08-30 MED ORDER — MELOXICAM 15 MG PO TABS: 15.0000 mg | ORAL_TABLET | Freq: Every day | ORAL | 2 refills | Status: AC

## 2023-08-30 NOTE — Patient Instructions (Signed)
 Can add 2 extra strength Tylenol  up to 3 times a day on top of the meloxicam .

## 2023-08-30 NOTE — Progress Notes (Signed)
   Established Patient Office Visit  Subjective  Patient ID: KHIA DIETERICH, female    DOB: 1980/12/21  Age: 43 y.o. MRN: [id number]  Chief Complaint  Patient presents with   Vaginal Discharge    HPI  She complains of vaginal odor for about 2 weeks she tried to get in last week on her day off.  No significant change in discharge no pain or irritation per se.  No urinary symptoms.  Left knee pain and swelling x 1 month. No known injury.  Pain at top of knee. No pain behind knee.  Worse with walking.  Not tried any medicine.  Denies any excess pressure or kneeling.    ROS    Objective:     BP 123/81   Pulse 93   Ht 5' 2 (1.575 m)   Wt 205 lb 9.6 oz (93.3 kg)   SpO2 100%   BMI 37.60 kg/m    Physical Exam Vitals reviewed.  Constitutional:      Appearance: Normal appearance.  HENT:     Head: Normocephalic.  Pulmonary:     Effort: Pulmonary effort is normal.  Musculoskeletal:     Comments: Left knee with significant crepitus and popping on exam with flexion and extension.  She is slightly tender just above the patella and more laterally.  Nontender along the joint lines.  Just some mild tenderness directly over the patella.  Neurological:     Mental Status: She is alert and oriented to person, place, and time.  Psychiatric:        Mood and Affect: Mood normal.        Behavior: Behavior normal.      No results found for any visits on 08/30/23.    The 10-year ASCVD risk score (Arnett DK, et al., 2019) is: 1.1%    Assessment & Plan:   Problem List Items Addressed This Visit   None Visit Diagnoses       Vaginal discharge    -  Primary   Relevant Orders   WET PREP FOR TRICH, YEAST, CLUE     Acute pain of left knee       Relevant Medications   meloxicam  (MOBIC ) 15 MG tablet   Other Relevant Orders   DG Knee Complete 4 Views Left      Vaginal d/c will call with results.    Left knee pain     Return if symptoms worsen or fail to improve.     Dorothyann Byars, MD

## 2023-09-01 ENCOUNTER — Ambulatory Visit: Payer: Self-pay | Admitting: Family Medicine

## 2023-09-01 LAB — WET PREP FOR TRICH, YEAST, CLUE
Clue Cell Exam: POSITIVE — AB
Trichomonas Exam: NEGATIVE
Yeast Exam: NEGATIVE

## 2023-09-01 LAB — SPECIMEN STATUS REPORT

## 2023-09-01 MED ORDER — METRONIDAZOLE 500 MG PO TABS: 500.0000 mg | ORAL_TABLET | Freq: Two times a day (BID) | ORAL | 0 refills | Status: DC

## 2023-09-01 NOTE — Progress Notes (Signed)
 HI [name], your swab was positive for bacterial vaginitis. I am sending over an antibiotic to clear this up.

## 2023-09-02 MED ORDER — FLUCONAZOLE 150 MG PO TABS: 150.0000 mg | ORAL_TABLET | Freq: Once | ORAL | 1 refills | Status: AC

## 2023-09-02 NOTE — Telephone Encounter (Signed)
 Meds ordered this encounter  Medications   metroNIDAZOLE  (FLAGYL ) 500 MG tablet    Sig: Take 1 tablet (500 mg total) by mouth 2 (two) times daily.    Dispense:  14 tablet    Refill:  0   fluconazole  (DIFLUCAN ) 150 MG tablet    Sig: Take 1 tablet (150 mg total) by mouth once for 1 dose.    Dispense:  1 tablet    Refill:  1

## 2023-09-05 ENCOUNTER — Other Ambulatory Visit: Payer: Self-pay | Admitting: Family Medicine

## 2023-09-05 DIAGNOSIS — I10 Essential (primary) hypertension: Secondary | ICD-10-CM

## 2023-09-07 ENCOUNTER — Encounter: Payer: Self-pay | Admitting: Family Medicine

## 2023-09-07 ENCOUNTER — Telehealth: Payer: Self-pay | Admitting: Family Medicine

## 2023-09-07 NOTE — Telephone Encounter (Signed)
 Copied from CRM #[id number]. Topic: General - Other >> Sep 07, 2023  2:42 PM Kevelyn M wrote: Reason for CRM: Insurance permission for December 02, 2022 and Date of service.   Call back #[phone] Invoice # [id number]

## 2023-09-07 NOTE — Progress Notes (Signed)
 HI [name],  X-ray of your knee shows no acute findings so no fracture or excess fluid.  They did see a little cyst on the bone.  Usually these are common with people who do have a little arthritis in the joint.  Neck step would be formal physical therapy for the knee or either home PT is that something that you feel you would like to do either in person or at home? If not improving over the next few weeks please let me know.

## 2023-09-07 NOTE — Telephone Encounter (Signed)
 If she is feeling much better in regards to pain she can stop the Tylenol .  if she still hurting then I would recommend continuing it for a little longer.

## 2023-09-07 NOTE — Telephone Encounter (Signed)
 Patient informed. States still having pain when puts pressure on it. She will continue Tylenol  for a little bit longer

## 2023-09-14 ENCOUNTER — Encounter: Payer: Self-pay | Admitting: Family Medicine

## 2023-09-14 ENCOUNTER — Ambulatory Visit (INDEPENDENT_AMBULATORY_CARE_PROVIDER_SITE_OTHER): Payer: PRIVATE HEALTH INSURANCE | Admitting: Family Medicine

## 2023-09-14 VITALS — BP 118/89 | HR 92 | Ht 62.0 in | Wt 207.0 lb

## 2023-09-14 DIAGNOSIS — G4700 Insomnia, unspecified: Secondary | ICD-10-CM | POA: Diagnosis not present

## 2023-09-14 DIAGNOSIS — T3995XA Adverse effect of unspecified nonopioid analgesic, antipyretic and antirheumatic, initial encounter: Secondary | ICD-10-CM

## 2023-09-14 DIAGNOSIS — I10 Essential (primary) hypertension: Secondary | ICD-10-CM

## 2023-09-14 DIAGNOSIS — R7301 Impaired fasting glucose: Secondary | ICD-10-CM

## 2023-09-14 DIAGNOSIS — Z23 Encounter for immunization: Secondary | ICD-10-CM | POA: Diagnosis not present

## 2023-09-14 DIAGNOSIS — G43009 Migraine without aura, not intractable, without status migrainosus: Secondary | ICD-10-CM

## 2023-09-14 DIAGNOSIS — G4440 Drug-induced headache, not elsewhere classified, not intractable: Secondary | ICD-10-CM

## 2023-09-14 MED ORDER — AMLODIPINE BESYLATE 10 MG PO TABS: 10.0000 mg | ORAL_TABLET | Freq: Every day | ORAL | 1 refills | Status: AC

## 2023-09-14 MED ORDER — PREDNISONE 10 MG PO TABS: ORAL_TABLET | ORAL | 0 refills | Status: AC

## 2023-09-14 MED ORDER — TRAZODONE HCL 50 MG PO TABS: 25.0000 mg | ORAL_TABLET | Freq: Every evening | ORAL | 3 refills | Status: AC | PRN

## 2023-09-14 NOTE — Progress Notes (Signed)
 Established Patient Office Visit  Subjective  Patient ID: [name], female    DOB: 02/21/1980  Age: 43 y.o. MRN: [id number]  Chief Complaint  Patient presents with   Hypertension    HPI  Discussed the use of AI scribe software for clinical note transcription with the patient, who gave verbal consent to proceed.  History of Present Illness [name] is a 43 year old female with hypertension who presents with concerns about blood pressure management and daily headaches.  Hypertension management - Hypertension managed with hydrochlorothiazide  12.5 mg (previously 25 mg, reduced due to drowsiness) and amlodipine  10 mg daily - Occasional perception of elevated blood pressure despite medication adherence - OK to  take an additional dose of hydrochlorothiazide  when perceiving elevated blood pressure - No chest pain, shortness of breath, or palpitations  Cephalgia (headache) - Daily headaches - Uses Excedrin daily for headache relief in addition to antihypertensive medications - Headaches tend to recur once medication effect diminishes  Sleep disturbance - Not currently taking trazodone  at night due to lack of refills - Previously used trazodone  to aid sleep, especially during extended work shifts - Attempting to reduce work hours to improve sleep quality  Preventive health maintenance - Aware of need for colon cancer screening at age 22 - No family history of colon cancer in first-degree relatives      ROS    Objective:     BP 118/89   Pulse 92   Ht 5' 2 (1.575 m)   Wt 207 lb (93.9 kg)   SpO2 97%   BMI 37.86 kg/m    Physical Exam Vitals and nursing note reviewed.  Constitutional:      Appearance: Normal appearance.  HENT:     Head: Normocephalic and atraumatic.  Eyes:     Conjunctiva/sclera: Conjunctivae normal.  Cardiovascular:     Rate and Rhythm: Normal rate and regular rhythm.  Pulmonary:     Effort: Pulmonary effort is normal.     Breath  sounds: Normal breath sounds.  Skin:    General: Skin is warm and dry.  Neurological:     Mental Status: She is alert.  Psychiatric:        Mood and Affect: Mood normal.      No results found for any visits on 09/14/23.    The 10-year ASCVD risk score (Arnett DK, et al., 2019) is: 0.9%    Assessment & Plan:   Problem List Items Addressed This Visit       Cardiovascular and Mediastinum   Migraine without aura   We also discussed that we could look at putting her on something prophylactically right now wanted try to break the rebound cycle that she is currently in and try to hit the reset button and see how often she is actually getting headaches.  Here today her blood pressures look absolutely phenomenal.      Relevant Medications   amLODipine  (NORVASC ) 10 MG tablet   traZODone  (DESYREL ) 50 MG tablet   Hypertension - Primary   Relevant Medications   amLODipine  (NORVASC ) 10 MG tablet   Other Relevant Orders   HgB A1c   CMP14+EGFR   Lipid Panel With LDL/HDL Ratio   TSH     Endocrine   IFG (impaired fasting glucose)   Due for A!C       Relevant Orders   HgB A1c   CMP14+EGFR   TSH     Other   Insomnia   Relevant Medications  traZODone  (DESYREL ) 50 MG tablet   Other Visit Diagnoses       Encounter for immunization       Relevant Orders   Flu vaccine HIGH DOSE PF(Fluzone Trivalent) (Completed)     Analgesic rebound headache       Relevant Medications   amLODipine  (NORVASC ) 10 MG tablet   traZODone  (DESYREL ) 50 MG tablet   predniSONE  (DELTASONE ) 10 MG tablet      Assessment and Plan Assessment & Plan Essential hypertension Blood pressure generally well-controlled with current medications, occasional elevations noted. Excedrin use may contribute to elevations due to caffeine. - Continue current blood pressure medications. - Consider switching hydrochlorothiazide  to chlorthalidone if drowsiness occurs. - Consider adding another antihypertensive  medication if blood pressure remains elevated.  Medication overuse headache (rebound headache) Daily Excedrin use causing rebound headaches. Discussed need to discontinue Excedrin and use prednisone  taper to minimize withdrawal. - Initiate a 5-day prednisone  taper (80 mg, 60 mg, 40 mg, 20 mg, 10 mg). - Instruct her to avoid all pain relievers during the prednisone  taper. - Consider alternative headache management strategies if headaches persist after taper. - Recheck kidney function due to prolonged Excedrin use.  Insomnia Uses trazodone  for sleep, especially after long shifts. Attempting to reduce work hours to improve sleep quality. - Refill trazodone  prescription for use as needed for sleep.  General Health Maintenance Discussed colon cancer screening options as she is now 62. Low risk for colon cancer due to lack of family history. - Discuss and decide on preferred colon cancer screening method. - Given  flu vaccination today  - Order blood work to recheck kidney function and other routine labs.   Return in about 6 months (around 03/13/2024) for Hypertension.    Dorothyann Byars, MD

## 2023-09-14 NOTE — Assessment & Plan Note (Signed)
 We also discussed that we could look at putting her on something prophylactically right now wanted try to break the rebound cycle that she is currently in and try to hit the reset button and see how often she is actually getting headaches.  Here today her blood pressures look absolutely phenomenal.

## 2023-09-14 NOTE — Patient Instructions (Signed)
 avoid all pain relievers during the prednisone  taper.  Think about your options for colon cancer screening and let me know if you would like to do either a full colonoscopy or the Cologuard box.

## 2023-09-14 NOTE — Assessment & Plan Note (Signed)
 Due for A!C

## 2023-09-15 ENCOUNTER — Ambulatory Visit: Payer: Self-pay | Admitting: Family Medicine

## 2023-09-15 LAB — CMP14+EGFR
ALT: 12 IU/L (ref 0–32)
AST: 14 IU/L (ref 0–40)
Albumin: 4.5 g/dL (ref 3.9–4.9)
Alkaline Phosphatase: 87 IU/L (ref 44–121)
BUN/Creatinine Ratio: 12 (ref 9–23)
BUN: 10 mg/dL (ref 6–24)
Bilirubin Total: 0.2 mg/dL (ref 0.0–1.2)
CO2: 22 mmol/L (ref 20–29)
Calcium: 9.8 mg/dL (ref 8.7–10.2)
Chloride: 104 mmol/L (ref 96–106)
Creatinine, Ser: 0.84 mg/dL (ref 0.57–1.00)
Globulin, Total: 2.9 g/dL (ref 1.5–4.5)
Glucose: 111 mg/dL — ABNORMAL HIGH (ref 70–99)
Potassium: 3.9 mmol/L (ref 3.5–5.2)
Sodium: 140 mmol/L (ref 134–144)
Total Protein: 7.4 g/dL (ref 6.0–8.5)
eGFR: 87 mL/min/1.73 (ref >59)

## 2023-09-15 LAB — LIPID PANEL WITH LDL/HDL RATIO
Cholesterol, Total: 149 mg/dL (ref 100–199)
HDL: 56 mg/dL (ref >39)
LDL Chol Calc (NIH): 78 mg/dL (ref 0–99)
LDL/HDL Ratio: 1.4 ratio (ref 0.0–3.2)
Triglycerides: 78 mg/dL (ref 0–149)
VLDL Cholesterol Cal: 15 mg/dL (ref 5–40)

## 2023-09-15 LAB — HEMOGLOBIN A1C
Est. average glucose Bld gHb Est-mCnc: 134 mg/dL
Hgb A1c MFr Bld: 6.3 % — ABNORMAL HIGH (ref 4.8–5.6)

## 2023-09-15 LAB — TSH: TSH: 2.26 u[IU]/mL (ref 0.450–4.500)

## 2023-09-15 NOTE — Progress Notes (Signed)
 Hi [name], A1c looks better this time at 6.3 great work and bringing that down compared to 9 months ago just continue to work on [employer] and regular exercise.  Kidney function looks good liver functions normal as well.  Cholesterol overall looks great.  Thyroid is normal.  Let us  know if we can get you scheduled for colon cancer screening since you are now 43.

## 2023-10-19 ENCOUNTER — Telehealth: Payer: Self-pay

## 2023-10-19 NOTE — Telephone Encounter (Signed)
 [facility] printed and placed up front for p/u Pt informed.

## 2023-10-19 NOTE — Telephone Encounter (Signed)
 Copied from CRM #[id number]. Topic: General - Other >> Oct 19, 2023  3:17 PM Dedra B wrote: Reason for CRM: Pt needs a document saying that she had the flu vaccine with her name on it. Her job will not accept the one that she has since her name isn't listed.

## 2023-11-02 ENCOUNTER — Encounter: Payer: Self-pay | Admitting: Family Medicine

## 2023-11-02 MED ORDER — METRONIDAZOLE 1.3 % VA GEL: 1.0000 | Freq: Every day | VAGINAL | 1 refills | Status: AC

## 2023-11-02 NOTE — Telephone Encounter (Signed)
 Meds ordered this encounter  Medications   metroNIDAZOLE  1.3 % GEL    Sig: Place 1 application  vaginally at bedtime. X 7 days    Dispense:  5 g    Refill:  1

## 2023-11-04 ENCOUNTER — Other Ambulatory Visit: Payer: Self-pay | Admitting: Obstetrics & Gynecology

## 2023-11-16 ENCOUNTER — Telehealth: Payer: Self-pay

## 2023-11-16 DIAGNOSIS — Z1231 Encounter for screening mammogram for malignant neoplasm of breast: Secondary | ICD-10-CM

## 2023-11-16 NOTE — Telephone Encounter (Signed)
 [name] declined the colon cancer screening.   She agreed to the mammogram.

## 2023-12-11 ENCOUNTER — Other Ambulatory Visit: Payer: Self-pay | Admitting: Family Medicine

## 2023-12-11 DIAGNOSIS — I10 Essential (primary) hypertension: Secondary | ICD-10-CM

## 2023-12-13 ENCOUNTER — Encounter: Payer: Self-pay | Admitting: Obstetrics & Gynecology

## 2023-12-20 ENCOUNTER — Ambulatory Visit: Payer: PRIVATE HEALTH INSURANCE | Admitting: Obstetrics & Gynecology

## 2024-03-16 ENCOUNTER — Ambulatory Visit: Payer: PRIVATE HEALTH INSURANCE | Admitting: Family Medicine
